# Patient Record
Sex: Female | Born: 1970 | Race: Black or African American | Hispanic: No | Marital: Single | Smoking: Never smoker
Health system: Southern US, Community
[De-identification: ages and names within clinical notes are randomized; demographics above are authoritative.]

## PROBLEM LIST (undated history)

## (undated) DIAGNOSIS — J189 Pneumonia, unspecified organism: Secondary | ICD-10-CM

## (undated) DIAGNOSIS — D649 Anemia, unspecified: Secondary | ICD-10-CM

## (undated) DIAGNOSIS — I10 Essential (primary) hypertension: Secondary | ICD-10-CM

## (undated) DIAGNOSIS — E119 Type 2 diabetes mellitus without complications: Secondary | ICD-10-CM

## (undated) HISTORY — PX: TUBAL LIGATION: SHX77

---

## 2004-09-09 ENCOUNTER — Ambulatory Visit: Payer: Self-pay | Admitting: Unknown Physician Specialty

## 2006-06-21 ENCOUNTER — Ambulatory Visit: Payer: Self-pay | Admitting: Gastroenterology

## 2007-07-02 ENCOUNTER — Emergency Department (HOSPITAL_COMMUNITY): Admission: EM | Admit: 2007-07-02 | Discharge: 2007-07-02 | Payer: Self-pay | Admitting: Emergency Medicine

## 2009-05-27 ENCOUNTER — Inpatient Hospital Stay (HOSPITAL_COMMUNITY): Admission: AD | Admit: 2009-05-27 | Discharge: 2009-05-27 | Payer: Self-pay | Admitting: Obstetrics & Gynecology

## 2010-03-31 LAB — WET PREP, GENITAL: Trich, Wet Prep: NONE SEEN

## 2010-06-25 ENCOUNTER — Emergency Department (HOSPITAL_BASED_OUTPATIENT_CLINIC_OR_DEPARTMENT_OTHER)
Admission: EM | Admit: 2010-06-25 | Discharge: 2010-06-25 | Disposition: A | Discharge status: Home / Self Care | Payer: No Typology Code available for payment source | Attending: Emergency Medicine | Admitting: Emergency Medicine

## 2010-06-25 ENCOUNTER — Emergency Department (INDEPENDENT_AMBULATORY_CARE_PROVIDER_SITE_OTHER): Payer: No Typology Code available for payment source

## 2010-06-25 DIAGNOSIS — M25539 Pain in unspecified wrist: Secondary | ICD-10-CM | POA: Insufficient documentation

## 2010-06-25 DIAGNOSIS — M542 Cervicalgia: Secondary | ICD-10-CM

## 2010-06-25 DIAGNOSIS — Y92410 Unspecified street and highway as the place of occurrence of the external cause: Secondary | ICD-10-CM | POA: Insufficient documentation

## 2010-06-25 DIAGNOSIS — S139XXA Sprain of joints and ligaments of unspecified parts of neck, initial encounter: Secondary | ICD-10-CM | POA: Insufficient documentation

## 2010-10-09 LAB — POCT URINALYSIS DIP (DEVICE)
Glucose, UA: NEGATIVE
Ketones, ur: NEGATIVE
Operator id: 282151
Specific Gravity, Urine: 1.025
Urobilinogen, UA: 0.2

## 2013-05-28 ENCOUNTER — Encounter (HOSPITAL_BASED_OUTPATIENT_CLINIC_OR_DEPARTMENT_OTHER): Payer: Self-pay | Admitting: Emergency Medicine

## 2013-05-28 ENCOUNTER — Emergency Department (HOSPITAL_BASED_OUTPATIENT_CLINIC_OR_DEPARTMENT_OTHER)
Admission: EM | Admit: 2013-05-28 | Discharge: 2013-05-28 | Disposition: A | Discharge status: Home / Self Care | Payer: Worker's Compensation | Attending: Emergency Medicine | Admitting: Emergency Medicine

## 2013-05-28 DIAGNOSIS — M545 Low back pain, unspecified: Secondary | ICD-10-CM

## 2013-05-28 DIAGNOSIS — X500XXA Overexertion from strenuous movement or load, initial encounter: Secondary | ICD-10-CM | POA: Insufficient documentation

## 2013-05-28 DIAGNOSIS — IMO0002 Reserved for concepts with insufficient information to code with codable children: Secondary | ICD-10-CM | POA: Insufficient documentation

## 2013-05-28 DIAGNOSIS — Y9289 Other specified places as the place of occurrence of the external cause: Secondary | ICD-10-CM | POA: Insufficient documentation

## 2013-05-28 DIAGNOSIS — X503XXA Overexertion from repetitive movements, initial encounter: Secondary | ICD-10-CM | POA: Insufficient documentation

## 2013-05-28 DIAGNOSIS — Y9389 Activity, other specified: Secondary | ICD-10-CM | POA: Insufficient documentation

## 2013-05-28 MED ORDER — PREDNISONE 50 MG PO TABS
60.0000 mg | ORAL_TABLET | Freq: Once | ORAL | Status: AC
  Administered 2013-05-28: 60 mg via ORAL
  Filled 2013-05-28 (×2): qty 1

## 2013-05-28 MED ORDER — PREDNISONE 10 MG PO TABS: 20.0000 mg | ORAL_TABLET | Freq: Every day | ORAL | Status: DC

## 2013-05-28 MED ORDER — OXYCODONE-ACETAMINOPHEN 5-325 MG PO TABS: 1.0000 | ORAL_TABLET | ORAL | Status: DC | PRN

## 2013-05-28 MED ORDER — OXYCODONE-ACETAMINOPHEN 5-325 MG PO TABS
2.0000 | ORAL_TABLET | Freq: Once | ORAL | Status: AC
  Administered 2013-05-28: 2 via ORAL
  Filled 2013-05-28: qty 2

## 2013-05-28 NOTE — ED Notes (Signed)
Onset of low back pain Thursday while lifting boxes at work.  Reports she felt the strain.  Pain has been affecting her ability to walk normally.  Pain radiates down her right leg occasionally.  Also reports numbness/tingling.

## 2013-05-28 NOTE — ED Provider Notes (Signed)
CSN: [id number]     Arrival date & time 05/28/13  1300 History   First MD Initiated Contact with Patient 05/28/13 1322     Chief Complaint  Patient presents with  . Back Pain     (Consider location/radiation/quality/duration/timing/severity/associated sxs/prior Treatment) Patient is a 43 y.o. female presenting with back pain. The history is provided by the patient.  Back Pain Location:  Lumbar spine Quality:  Aching Radiates to: right buttock. Pain severity:  Severe Pain is:  Worse during the night Onset quality:  Sudden Timing:  Constant Progression:  Unchanged Chronicity:  New Context: lifting heavy objects   Relieved by:  Nothing Worsened by:  Lying down Associated symptoms: no bladder incontinence, no bowel incontinence, no numbness, no paresthesias, no perianal numbness, no tingling and no weakness   Associated symptoms comment:  Patient seen at occupational health on Friday and started on muscle relaxants and anti-inflammatory medication. She states this has not helped. Risk factors: obesity   Risk factors: no hx of cancer, no recent surgery and no steroid use     History reviewed. No pertinent past medical history. Past Surgical History  Procedure Laterality Date  . Tubal ligation     No family history on file. History  Substance Use Topics  . Smoking status: Never Smoker   . Smokeless tobacco: Not on file  . Alcohol Use: Yes   OB History   Grav Para Term Preterm Abortions TAB SAB Ect Mult Living                 Review of Systems  Gastrointestinal: Negative for bowel incontinence.  Genitourinary: Negative for bladder incontinence.  Musculoskeletal: Positive for back pain.  Neurological: Negative for tingling, weakness, numbness and paresthesias.  All other systems reviewed and are negative.     Allergies  Review of patient's allergies indicates no known allergies.  Home Medications   Prior to Admission medications   Medication Sig Start Date End  Date Taking? Authorizing Provider  oxyCODONE-acetaminophen (PERCOCET/ROXICET) 5-325 MG per tablet Take 1 tablet by mouth every 4 (four) hours as needed for severe pain. 05/28/13   Hilario Quarryanielle S Robbin Escher, MD  predniSONE (DELTASONE) 10 MG tablet Take 2 tablets (20 mg total) by mouth daily. 05/28/13   Hilario Quarryanielle S Florrie Ramires, MD   BP 161/99  Pulse 71  Temp(Src) 98.7 F (37.1 C) (Oral)  Resp 20  Ht 5\' 1"  (1.549 m)  Wt 270 lb (122.471 kg)  BMI 51.04 kg/m2  SpO2 100%  LMP 05/24/2013 Physical Exam  Nursing note and vitals reviewed. Constitutional: She is oriented to person, place, and time. She appears well-developed and well-nourished.  HENT:  Head: Normocephalic and atraumatic.  Right Ear: Tympanic membrane and external ear normal.  Left Ear: Tympanic membrane and external ear normal.  Nose: Nose normal. Right sinus exhibits no maxillary sinus tenderness and no frontal sinus tenderness. Left sinus exhibits no maxillary sinus tenderness and no frontal sinus tenderness.  Eyes: Conjunctivae and EOM are normal. Pupils are equal, round, and reactive to light. Right eye exhibits no nystagmus. Left eye exhibits no nystagmus.  Neck: Normal range of motion. Neck supple.  Cardiovascular: Normal rate, regular rhythm, normal heart sounds and intact distal pulses.   Pulmonary/Chest: Effort normal and breath sounds normal. No respiratory distress. She exhibits no tenderness.  Abdominal: Soft. Bowel sounds are normal. She exhibits no distension and no mass. There is no tenderness.  Musculoskeletal: Normal range of motion. She exhibits no edema and no tenderness.  Neurological:  She is alert and oriented to person, place, and time. She has normal strength and normal reflexes. No sensory deficit. She displays a negative Romberg sign. GCS eye subscore is 4. GCS verbal subscore is 5. GCS motor subscore is 6.  Reflex Scores:      Tricep reflexes are 2+ on the right side and 2+ on the left side.      Bicep reflexes are 2+ on the  right side and 2+ on the left side.      Brachioradialis reflexes are 2+ on the right side and 2+ on the left side.      Patellar reflexes are 2+ on the right side and 2+ on the left side.      Achilles reflexes are 2+ on the right side and 2+ on the left side. Patient with normal gait without ataxia, shuffling, spasm, or antalgia. Speech is normal without dysarthria, dysphasia, or aphasia. Muscle strength is 5/5 in bilateral shoulders, elbow flexor and extensors, wrist flexor and extensors, and intrinsic hand muscles. 5/5 bilateral lower extremity hip flexors, extensors, knee flexors and extensors, and ankle dorsi and plantar flexors.    Skin: Skin is warm and dry. No rash noted.  Psychiatric: She has a normal mood and affect. Her behavior is normal. Judgment and thought content normal.    ED Course  Procedures (including critical care time) Labs Review Labs Reviewed - No data to display  Imaging Review No results found.   EKG Interpretation None      MDM   Final diagnoses:  Low back pain        Hilario Quarryanielle S Albaraa Swingle, MD 05/28/13 1512

## 2013-05-28 NOTE — Discharge Instructions (Signed)
Back Exercises °Back exercises help treat and prevent back injuries. The goal of back exercises is to increase the strength of your abdominal and back muscles and the flexibility of your back. These exercises should be started when you no longer have back pain. Back exercises include: °· Pelvic Tilt. Lie on your back with your knees bent. Tilt your pelvis until the lower part of your back is against the floor. Hold this position 5 to 10 sec and repeat 5 to 10 times. °· Knee to Chest. Pull first 1 knee up against your chest and hold for 20 to 30 seconds, repeat this with the other knee, and then both knees. This may be done with the other leg straight or bent, whichever feels better. °· Sit-Ups or Curl-Ups. Bend your knees 90 degrees. Start with tilting your pelvis, and do a partial, slow sit-up, lifting your trunk only 30 to 45 degrees off the floor. Take at least 2 to 3 seconds for each sit-up. Do not do sit-ups with your knees out straight. If partial sit-ups are difficult, simply do the above but with only tightening your abdominal muscles and holding it as directed. °· Hip-Lift. Lie on your back with your knees flexed 90 degrees. Push down with your feet and shoulders as you raise your hips a couple inches off the floor; hold for 10 seconds, repeat 5 to 10 times. °· Back arches. Lie on your stomach, propping yourself up on bent elbows. Slowly press on your hands, causing an arch in your low back. Repeat 3 to 5 times. Any initial stiffness and discomfort should lessen with repetition over time. °· Shoulder-Lifts. Lie face down with arms beside your body. Keep hips and torso pressed to floor as you slowly lift your head and shoulders off the floor. °Do not overdo your exercises, especially in the beginning. Exercises may cause you some mild back discomfort which lasts for a few minutes; however, if the pain is more severe, or lasts for more than 15 minutes, do not continue exercises until you see your caregiver.  Improvement with exercise therapy for back problems is slow.  °See your caregivers for assistance with developing a proper back exercise program. °Document Released: 02/06/2004 Document Revised: 03/23/2011 Document Reviewed: 10/30/2010 °ExitCare® Patient Information ©2014 [employer]. ° °Back Pain, Adult °Back pain is very common. The pain often gets better over time. The cause of back pain is usually not dangerous. Most people can learn to manage their back pain on their own.  °HOME CARE  °· Stay active. Start with short walks on flat ground if you can. Try to walk farther each day. °· Do not sit, drive, or stand in one place for more than 30 minutes. Do not stay in bed. °· Do not avoid exercise or work. Activity can help your back heal faster. °· Be careful when you bend or lift an object. Bend at your knees, keep the object close to you, and do not twist. °· Sleep on a firm mattress. Lie on your side, and bend your knees. If you lie on your back, put a pillow under your knees. °· Only take medicines as told by your doctor. °· Put ice on the injured area. °· Put ice in a plastic bag. °· Place a towel between your skin and the bag. °· Leave the ice on for 15-20 minutes, 03-04 times a day for the first 2 to 3 days. After that, you can switch between ice and heat packs. °· Ask your   doctor about back exercises or massage. °· Avoid feeling anxious or stressed. Find good ways to deal with stress, such as exercise. °GET HELP RIGHT AWAY IF:  °· Your pain does not go away with rest or medicine. °· Your pain does not go away in 1 week. °· You have new problems. °· You do not feel well. °· The pain spreads into your legs. °· You cannot control when you poop (bowel movement) or pee (urinate). °· Your arms or legs feel weak or lose feeling (numbness). °· You feel sick to your stomach (nauseous) or throw up (vomit). °· You have belly (abdominal) pain. °· You feel like you may pass out (faint). °MAKE SURE YOU:  °· Understand  these instructions. °· Will watch your condition. °· Will get help right away if you are not doing well or get worse. °Document Released: 06/17/2007 Document Revised: 03/23/2011 Document Reviewed: 05/19/2010 °ExitCare® Patient Information ©2014 [employer]. ° °

## 2013-06-12 ENCOUNTER — Other Ambulatory Visit: Payer: Self-pay | Admitting: Occupational Medicine

## 2013-06-12 ENCOUNTER — Ambulatory Visit: Payer: Worker's Compensation

## 2013-06-12 DIAGNOSIS — M549 Dorsalgia, unspecified: Secondary | ICD-10-CM

## 2013-07-14 ENCOUNTER — Encounter (HOSPITAL_BASED_OUTPATIENT_CLINIC_OR_DEPARTMENT_OTHER): Payer: Self-pay | Admitting: Emergency Medicine

## 2013-07-14 ENCOUNTER — Emergency Department (HOSPITAL_BASED_OUTPATIENT_CLINIC_OR_DEPARTMENT_OTHER): Payer: Self-pay

## 2013-07-14 ENCOUNTER — Emergency Department (HOSPITAL_BASED_OUTPATIENT_CLINIC_OR_DEPARTMENT_OTHER)
Admission: EM | Admit: 2013-07-14 | Discharge: 2013-07-15 | Disposition: A | Discharge status: Home / Self Care | Payer: Self-pay | Attending: Emergency Medicine | Admitting: Emergency Medicine

## 2013-07-14 DIAGNOSIS — IMO0002 Reserved for concepts with insufficient information to code with codable children: Secondary | ICD-10-CM | POA: Insufficient documentation

## 2013-07-14 DIAGNOSIS — J159 Unspecified bacterial pneumonia: Secondary | ICD-10-CM | POA: Insufficient documentation

## 2013-07-14 DIAGNOSIS — R071 Chest pain on breathing: Secondary | ICD-10-CM | POA: Insufficient documentation

## 2013-07-14 DIAGNOSIS — IMO0001 Reserved for inherently not codable concepts without codable children: Secondary | ICD-10-CM | POA: Insufficient documentation

## 2013-07-14 DIAGNOSIS — J029 Acute pharyngitis, unspecified: Secondary | ICD-10-CM | POA: Insufficient documentation

## 2013-07-14 DIAGNOSIS — Z79899 Other long term (current) drug therapy: Secondary | ICD-10-CM | POA: Insufficient documentation

## 2013-07-14 DIAGNOSIS — R509 Fever, unspecified: Secondary | ICD-10-CM | POA: Insufficient documentation

## 2013-07-14 DIAGNOSIS — J189 Pneumonia, unspecified organism: Secondary | ICD-10-CM

## 2013-07-14 DIAGNOSIS — R0789 Other chest pain: Secondary | ICD-10-CM

## 2013-07-14 LAB — BASIC METABOLIC PANEL
Anion gap: 14 (ref 5–15)
BUN: 9 mg/dL (ref 6–23)
CALCIUM: 9.3 mg/dL (ref 8.4–10.5)
CO2: 24 meq/L (ref 19–32)
CREATININE: 1 mg/dL (ref 0.50–1.10)
Chloride: 101 mEq/L (ref 96–112)
GFR calc Af Amer: 78 mL/min — ABNORMAL LOW (ref >90)
GFR calc non Af Amer: 67 mL/min — ABNORMAL LOW (ref >90)
GLUCOSE: 118 mg/dL — AB (ref 70–99)
Potassium: 4.1 mEq/L (ref 3.7–5.3)
Sodium: 139 mEq/L (ref 137–147)

## 2013-07-14 LAB — CBC WITH DIFFERENTIAL/PLATELET
BASOS PCT: 0 % (ref 0–1)
Basophils Absolute: 0 10*3/uL (ref 0.0–0.1)
EOS ABS: 0.3 10*3/uL (ref 0.0–0.7)
Eosinophils Relative: 3 % (ref 0–5)
HEMATOCRIT: 27.9 % — AB (ref 36.0–46.0)
HEMOGLOBIN: 8.3 g/dL — AB (ref 12.0–15.0)
Lymphocytes Relative: 22 % (ref 12–46)
Lymphs Abs: 1.9 10*3/uL (ref 0.7–4.0)
MCH: 20 pg — AB (ref 26.0–34.0)
MCHC: 29.7 g/dL — AB (ref 30.0–36.0)
MCV: 67.2 fL — ABNORMAL LOW (ref 78.0–100.0)
MONO ABS: 1 10*3/uL (ref 0.1–1.0)
Monocytes Relative: 11 % (ref 3–12)
NEUTROS ABS: 5.6 10*3/uL (ref 1.7–7.7)
NEUTROS PCT: 64 % (ref 43–77)
Platelets: 300 10*3/uL (ref 150–400)
RBC: 4.15 MIL/uL (ref 3.87–5.11)
RDW: 19.7 % — AB (ref 11.5–15.5)
WBC: 8.8 10*3/uL (ref 4.0–10.5)

## 2013-07-14 MED ORDER — SODIUM CHLORIDE 0.9 % IV BOLUS (SEPSIS)
1000.0000 mL | Freq: Once | INTRAVENOUS | Status: AC
  Administered 2013-07-14: 1000 mL via INTRAVENOUS

## 2013-07-14 MED ORDER — KETOROLAC TROMETHAMINE 30 MG/ML IJ SOLN
30.0000 mg | Freq: Once | INTRAMUSCULAR | Status: AC
  Administered 2013-07-14: 30 mg via INTRAVENOUS
  Filled 2013-07-14: qty 1

## 2013-07-14 MED ORDER — DEXTROSE 5 % IV SOLN
1.0000 g | Freq: Once | INTRAVENOUS | Status: AC
  Administered 2013-07-14: 1 g via INTRAVENOUS

## 2013-07-14 MED ORDER — IOHEXOL 300 MG/ML  SOLN
80.0000 mL | Freq: Once | INTRAMUSCULAR | Status: AC | PRN
  Administered 2013-07-14: 80 mL via INTRAVENOUS

## 2013-07-14 MED ORDER — CEFTRIAXONE SODIUM 1 G IJ SOLR
INTRAMUSCULAR | Status: AC
  Filled 2013-07-14: qty 10

## 2013-07-14 NOTE — ED Provider Notes (Signed)
CSN: [id number]     Arrival date & time 07/14/13  1934 History  This chart was scribed for Loren Raceravid Altha Sweitzer, MD by Evon Slackerrance Branch, ED Scribe. This patient was seen in room MH01/MH01 and the patient's care was started at 11:04 PM.     Chief Complaint  Patient presents with  . Cough   Patient is a 43 y.o. female presenting with cough. The history is provided by the patient. No language interpreter was used.  Cough Associated symptoms: chest pain, chills, eye discharge, fever, myalgias and sore throat   Associated symptoms: no headaches, no rash and no shortness of breath    HPI Comments: [name] [name] is a 43 y.o. female who presents to the Emergency Department complaining of productive cough of yellow sputum onset 4 days. States she has associated chest pain brought on from cough. She also complains of fever, chills, sore throat and left eye redness. She denies being recently admitted to the hospital. She denies nausea, abdominal pain and vomiting. She has no lower extremity swelling or pain.  She states that she doesn't currently have a PCP  History reviewed. No pertinent past medical history. Past Surgical History  Procedure Laterality Date  . Tubal ligation     No family history on file. History  Substance Use Topics  . Smoking status: Never Smoker   . Smokeless tobacco: Not on file  . Alcohol Use: Yes   OB History   Grav Para Term Preterm Abortions TAB SAB Ect Mult Living                 Review of Systems  Constitutional: Positive for fever and chills.  HENT: Positive for sore throat.   Eyes: Positive for discharge and redness.  Respiratory: Positive for cough. Negative for shortness of breath.   Cardiovascular: Positive for chest pain. Negative for palpitations and leg swelling.  Gastrointestinal: Negative for nausea, abdominal pain and diarrhea.  Genitourinary: Negative for dysuria.  Musculoskeletal: Positive for myalgias. Negative for back pain, neck pain and neck  stiffness.  Skin: Negative for rash and wound.  Neurological: Negative for dizziness, weakness, light-headedness, numbness and headaches.  All other systems reviewed and are negative.   Allergies  Review of patient's allergies indicates no known allergies.  Home Medications   Prior to Admission medications   Medication Sig Start Date End Date Taking? Authorizing Provider  oxyCODONE-acetaminophen (PERCOCET/ROXICET) 5-325 MG per tablet Take 1 tablet by mouth every 4 (four) hours as needed for severe pain. 05/28/13   Hilario Quarryanielle S Ray, MD  predniSONE (DELTASONE) 10 MG tablet Take 2 tablets (20 mg total) by mouth daily. 05/28/13   Hilario Quarryanielle S Ray, MD   Triage Vitals: BP 146/91  Pulse 121  Temp(Src) 99.2 F (37.3 C) (Oral)  Resp 20  Ht 5\' 5"  (1.651 m)  Wt 270 lb (122.471 kg)  BMI 44.93 kg/m2  SpO2 97%  LMP 06/14/2013  Physical Exam  Nursing note and vitals reviewed. Constitutional: She is oriented to person, place, and time. She appears well-developed and well-nourished. No distress.  HENT:  Head: Normocephalic and atraumatic.  Mouth/Throat: Oropharynx is clear and moist. No oropharyngeal exudate.  Eyes: EOM are normal. Pupils are equal, round, and reactive to light. Right eye exhibits discharge.  Right scleral injection with purulent discharge.  Neck: Normal range of motion. Neck supple.  No meningismus  Cardiovascular: Normal rate and regular rhythm.  Exam reveals no gallop and no friction rub.   No murmur heard. Pulmonary/Chest: Effort normal. No  respiratory distress. She has no wheezes. She has no rales.  Decreased breath sounds in the bases.  Abdominal: Soft. Bowel sounds are normal. She exhibits no distension and no mass. There is no tenderness. There is no rebound and no guarding.  Musculoskeletal: Normal range of motion. She exhibits no edema and no tenderness.  No calf swelling or tenderness.  Neurological: She is alert and oriented to person, place, and time.  Moves all  extremities without deficit. Sensation is grossly intact.  Skin: Skin is warm and dry. No rash noted. No erythema.  Psychiatric: She has a normal mood and affect. Her behavior is normal.    ED Course  Procedures (including critical care time) DIAGNOSTIC STUDIES: Oxygen Saturation is 97% on RA, normal by my interpretation.    COORDINATION OF CARE:    Labs Review Labs Reviewed  CBC WITH DIFFERENTIAL - Abnormal; Notable for the following:    Hemoglobin 8.3 (*)    HCT 27.9 (*)    MCV 67.2 (*)    MCH 20.0 (*)    MCHC 29.7 (*)    RDW 19.7 (*)    All other components within normal limits  BASIC METABOLIC PANEL - Abnormal; Notable for the following:    Glucose, Bld 118 (*)    GFR calc non Af Amer 67 (*)    GFR calc Af Amer 78 (*)    All other components within normal limits    Imaging Review Dg Chest 2 View  07/14/2013   CLINICAL DATA:  Four day history of cough.  EXAM: CHEST  2 VIEW  COMPARISON:  None.  FINDINGS: Cardiac silhouette normal in size. Right paratracheal opacity at the superior right heart border. Linear atelectasis in the inferior right upper lobe. Lungs otherwise clear. No localized airspace consolidation. No pleural effusions. No pneumothorax. Normal pulmonary vascularity. Visualized bony thorax intact.  IMPRESSION: 1. Right paratracheal opacity at the superior right heart border, possible mass or adenopathy versus ectatic ascending thoracic aorta. CT of the chest with contrast is suggested in further evaluation. 2. Associated atelectasis involving the inferior right upper lobe. No acute cardiopulmonary disease otherwise.   Electronically Signed   By: Hulan Saas M.D.   On: 07/14/2013 19:53   Ct Chest W Contrast  07/14/2013   CLINICAL DATA:  Abnormal chest radiograph, cough  EXAM: CT CHEST WITH CONTRAST  TECHNIQUE: Multidetector CT imaging of the chest was performed during intravenous contrast administration.  CONTRAST:  80mL OMNIPAQUE IOHEXOL 300 MG/ML  SOLN   COMPARISON:  Chest radiograph dated 07/14/2013  FINDINGS: Radiographic abnormality corresponds to a vascular contour. No hilar/perihilar mass is evident on CT.  Mild patchy opacity in the lateral right upper lobe (series 3/image 19). Additional mild patchy subpleural opacity in the left lower lobe (series 3/image 31). Given the clinical history, this appearance is suspicious for pneumonia.  Additional mild linear opacity in the bilateral lower lobes, likely atelectasis. No suspicious pulmonary nodules. No pleural effusion or pneumothorax.  Visualized thyroid is unremarkable.  The heart is normal in size.  No pericardial effusion.  No suspicious mediastinal, hilar, or axillary lymphadenopathy.  Visualized upper abdomen is unremarkable.  Mild degenerative changes of the visualized thoracolumbar spine.  IMPRESSION: Radiographic abnormality corresponds to a vascular contour. No hilar/perihilar mass is seen on CT.  Mild patchy opacity in the right upper lobe and left lower lobe, suspicious for mild multifocal pneumonia.   Electronically Signed   By: Charline Bills M.D.   On: 07/14/2013 21:38  EKG Interpretation None      MDM   Final diagnoses:  None       I personally performed the services described in this documentation, which was scribed in my presence. The recorded information has been reviewed and is accurate.  Patient with mild pneumonia by CT chest. There is no evidence of mass. Chest pain is consistent with musculoskeletal chest pain likely due to coughing and muscle strain. Very low suspicion for coronary artery disease. Her symptoms are improved after IV fluid and Toradol. Heart rate has improved to less than 100. She is maintaining her oxygen saturations and is in no respiratory distress. She received an IV dose of Rocephin in emergency department and will be discharged home with Z-Pak. She's been given very strict return precautions including the need to return immediately for  worsening pain, difficulty breathing, persistent fever or for any concerns.     Loren Raceravid Harout Scheurich, MD 07/15/13 [phone]

## 2013-07-14 NOTE — ED Notes (Signed)
Cough with yellow sputum x 4 days.

## 2013-07-14 NOTE — ED Notes (Signed)
MD at bedside. 

## 2013-07-14 NOTE — ED Notes (Signed)
Pt c/o productive cough x 4 days  When first start had blood tinged sputum  Now it is yellow

## 2013-07-15 MED ORDER — BENZONATATE 100 MG PO CAPS: 100.0000 mg | ORAL_CAPSULE | Freq: Three times a day (TID) | ORAL | Status: DC

## 2013-07-15 MED ORDER — AZITHROMYCIN 250 MG PO TABS: ORAL_TABLET | ORAL | Status: DC

## 2013-07-15 MED ORDER — BACITRACIN-POLYMYXIN B 500-10000 UNIT/GM OP OINT: 1.0000 "application " | TOPICAL_OINTMENT | Freq: Two times a day (BID) | OPHTHALMIC | Status: DC

## 2013-07-15 MED ORDER — IBUPROFEN 600 MG PO TABS: 600.0000 mg | ORAL_TABLET | Freq: Four times a day (QID) | ORAL | Status: DC | PRN

## 2013-07-15 NOTE — Discharge Instructions (Signed)
Pneumonia, Adult °Pneumonia is an infection of the lungs.  °CAUSES °Pneumonia may be caused by bacteria or a virus. Usually, these infections are caused by breathing infectious particles into the lungs (respiratory tract). °SYMPTOMS  °· Cough. °· Fever. °· Chest pain. °· Increased rate of breathing. °· Wheezing. °· Mucus production. °DIAGNOSIS  °If you have the common symptoms of pneumonia, your caregiver will typically confirm the diagnosis with a chest X-ray. The X-ray will show an abnormality in the lung (pulmonary infiltrate) if you have pneumonia. Other tests of your blood, urine, or sputum may be done to find the specific cause of your pneumonia. Your caregiver may also do tests (blood gases or pulse oximetry) to see how well your lungs are working. °TREATMENT  °Some forms of pneumonia may be spread to other people when you cough or sneeze. You may be asked to wear a mask before and during your exam. Pneumonia that is caused by bacteria is treated with antibiotic medicine. Pneumonia that is caused by the influenza virus may be treated with an antiviral medicine. Most other viral infections must run their course. These infections will not respond to antibiotics.  °PREVENTION °A pneumococcal shot (vaccine) is available to prevent a common bacterial cause of pneumonia. This is usually suggested for: °· People over 65 years old. °· Patients on chemotherapy. °· People with chronic lung problems, such as bronchitis or emphysema. °· People with immune system problems. °If you are over 65 or have a high risk condition, you may receive the pneumococcal vaccine if you have not received it before. In some countries, a routine influenza vaccine is also recommended. This vaccine can help prevent some cases of pneumonia. You may be offered the influenza vaccine as part of your care. °If you smoke, it is time to quit. You may receive instructions on how to stop smoking. Your caregiver can provide medicines and counseling to  help you quit. °HOME CARE INSTRUCTIONS  °· Cough suppressants may be used if you are losing too much rest. However, coughing protects you by clearing your lungs. You should avoid using cough suppressants if you can. °· Your caregiver may have prescribed medicine if he or she thinks your pneumonia is caused by a bacteria or influenza. Finish your medicine even if you start to feel better. °· Your caregiver may also prescribe an expectorant. This loosens the mucus to be coughed up. °· Only take over-the-counter or prescription medicines for pain, discomfort, or fever as directed by your caregiver. °· Do not smoke. Smoking is a common cause of bronchitis and can contribute to pneumonia. If you are a smoker and continue to smoke, your cough may last several weeks after your pneumonia has cleared. °· A cold steam vaporizer or humidifier in your room or home may help loosen mucus. °· Coughing is often worse at night. Sleeping in a semi-upright position in a recliner or using a couple pillows under your head will help with this. °· Get rest as you feel it is needed. Your body will usually let you know when you need to rest. °SEEK IMMEDIATE MEDICAL CARE IF:  °· Your illness becomes worse. This is especially true if you are elderly or weakened from any other disease. °· You cannot control your cough with suppressants and are losing sleep. °· You begin coughing up blood. °· You develop pain which is getting worse or is uncontrolled with medicines. °· You have a fever. °· Any of the symptoms which initially brought you in for treatment   are getting worse rather than better.  You develop shortness of breath or chest pain. MAKE SURE YOU:   Understand these instructions.  Will watch your condition.  Will get help right away if you are not doing well or get worse. Document Released: 12/29/2004 Document Revised: 03/23/2011 Document Reviewed: 03/20/2010 [facility]Exit[facility] Patient Information 2015 [place]Care, [place]. This information is  not intended to replace advice given to you by your health care provider. Make sure you discuss any questions you have with your health care provider.  Chest Wall Pain Chest wall pain is pain felt in or around the chest bones and muscles. It may take up to 6 weeks to get better. It may take longer if you are active. Chest wall pain can happen on its own. Other times, things like germs, injury, coughing, or exercise can cause the pain. HOME CARE   Avoid activities that make you tired or cause pain. Try not to use your chest, belly (abdominal), or side muscles. Do not use heavy weights.  Put ice on the sore area.  Put ice in a plastic bag.  Place a towel between your skin and the bag.  Leave the ice on for 15-20 minutes for the first 2 days.  Only take medicine as told by your doctor. GET HELP RIGHT AWAY IF:   You have more pain or are very uncomfortable.  You have a fever.  Your chest pain gets worse.  You have new problems.  You feel sick to your stomach (nauseous) or throw up (vomit).  You start to sweat or feel lightheaded.  You have a cough with mucus (phlegm).  You cough up blood. MAKE SURE YOU:   Understand these instructions.  Will watch your condition.  Will get help right away if you are not doing well or get worse. Document Released: 06/17/2007 Document Revised: 03/23/2011 Document Reviewed: 08/25/2010 [facility] Patient Information 2015 [place]Care, [place]LLC. This information is not intended to replace advice given to you by your health care provider. Make sure you discuss any questions you have with your health care provider.

## 2013-10-12 NOTE — Progress Notes (Signed)
Please put orders in Epic surgery 10-25-13 pre op 10-17-13 Thanks

## 2013-10-13 ENCOUNTER — Ambulatory Visit: Payer: Self-pay | Admitting: Orthopedic Surgery

## 2013-10-16 ENCOUNTER — Encounter (HOSPITAL_COMMUNITY): Payer: Self-pay | Admitting: Pharmacy Technician

## 2013-10-17 ENCOUNTER — Ambulatory Visit (HOSPITAL_COMMUNITY): Admission: RE | Admit: 2013-10-17 | Payer: No Typology Code available for payment source | Source: Ambulatory Visit

## 2013-10-17 ENCOUNTER — Ambulatory Visit (HOSPITAL_COMMUNITY)
Admission: RE | Admit: 2013-10-17 | Discharge: 2013-10-17 | Disposition: A | Discharge status: Home / Self Care | Payer: Worker's Compensation | Source: Ambulatory Visit | Attending: Anesthesiology | Admitting: Anesthesiology

## 2013-10-17 ENCOUNTER — Encounter (HOSPITAL_COMMUNITY)
Admission: RE | Admit: 2013-10-17 | Discharge: 2013-10-17 | Disposition: A | Discharge status: Home / Self Care | Payer: Worker's Compensation | Source: Ambulatory Visit | Attending: Specialist | Admitting: Specialist

## 2013-10-17 ENCOUNTER — Encounter (HOSPITAL_COMMUNITY): Payer: Self-pay

## 2013-10-17 ENCOUNTER — Ambulatory Visit (HOSPITAL_COMMUNITY)
Admission: RE | Admit: 2013-10-17 | Discharge: 2013-10-17 | Disposition: A | Discharge status: Home / Self Care | Payer: Worker's Compensation | Source: Ambulatory Visit | Attending: Orthopedic Surgery | Admitting: Orthopedic Surgery

## 2013-10-17 DIAGNOSIS — M5126 Other intervertebral disc displacement, lumbar region: Secondary | ICD-10-CM | POA: Insufficient documentation

## 2013-10-17 DIAGNOSIS — M47896 Other spondylosis, lumbar region: Secondary | ICD-10-CM | POA: Diagnosis not present

## 2013-10-17 DIAGNOSIS — Z0000 Encounter for general adult medical examination without abnormal findings: Secondary | ICD-10-CM | POA: Insufficient documentation

## 2013-10-17 HISTORY — DX: Pneumonia, unspecified organism: J18.9

## 2013-10-17 LAB — BASIC METABOLIC PANEL
Anion gap: 10 (ref 5–15)
BUN: 9 mg/dL (ref 6–23)
CO2: 25 meq/L (ref 19–32)
CREATININE: 0.95 mg/dL (ref 0.50–1.10)
Calcium: 8.9 mg/dL (ref 8.4–10.5)
Chloride: 100 mEq/L (ref 96–112)
GFR calc Af Amer: 83 mL/min — ABNORMAL LOW (ref >90)
GFR calc non Af Amer: 71 mL/min — ABNORMAL LOW (ref >90)
Glucose, Bld: 104 mg/dL — ABNORMAL HIGH (ref 70–99)
Potassium: 4.4 mEq/L (ref 3.7–5.3)
Sodium: 135 mEq/L — ABNORMAL LOW (ref 137–147)

## 2013-10-17 LAB — CBC
HEMATOCRIT: 30.2 % — AB (ref 36.0–46.0)
Hemoglobin: 9 g/dL — ABNORMAL LOW (ref 12.0–15.0)
MCH: 20 pg — ABNORMAL LOW (ref 26.0–34.0)
MCHC: 29.8 g/dL — AB (ref 30.0–36.0)
MCV: 67 fL — ABNORMAL LOW (ref 78.0–100.0)
Platelets: 311 10*3/uL (ref 150–400)
RBC: 4.51 MIL/uL (ref 3.87–5.11)
RDW: 19.9 % — ABNORMAL HIGH (ref 11.5–15.5)
WBC: 5.9 10*3/uL (ref 4.0–10.5)

## 2013-10-17 LAB — HCG, SERUM, QUALITATIVE: Preg, Serum: NEGATIVE

## 2013-10-17 NOTE — Progress Notes (Signed)
Pt to ER on 07/14/2013 had cxr and CT of chest preformed both in epic pt treated for pneumonia pt denies any bouts with pneumonia during PAT visit new cxr obtained

## 2013-10-17 NOTE — Progress Notes (Signed)
BMP and CBC results per PAT visit on 10/17/2013 / epic sent to Dr Jene EveryJeffrey Beane

## 2013-10-17 NOTE — Patient Instructions (Signed)
20 [name] BihariShonda [name]  10/17/2013   Your procedure is scheduled on:        10/25/2013  [facility] and follow signs to  Short Stay Center arrive at 0900 AM.   Call this number if you have problems the morning of surgery [phone] or Presurgical Testing [phone].   Remember:  Do not eat food or drink liquids :After Midnight.  For Living Will and/or Health Care Power Attorney Forms: please provide copy for your medical record, may bring AM of surgery (forms should be already notarized-we do not provide this service).    Take these medicines the morning of surgery with A SIP OF WATER: hydrocodone-acetaminophen if needed for pain.                               You may not have any metal on your body including hair pins and piercings  Do not wear jewelry, make-up, lotions, powders, or deodorant.  Do not shave body hair  48 hours(2 days) of CHG soap use.                Do not bring valuables to the hospital. [facility] IS NOT RESPONSIBLE FOR VALUABLES.  Contacts, dentures or bridgework may not be worn into surgery.  Leave suitcase in the car. After surgery it may be brought to your room.  For patients admitted to the hospital, checkout time is 11:00 AM the day of discharge.     Special Instructions: review fact sheets for Incentive Spirometry.  ________________________________________________________________________  [facility] - Preparing for Surgery Before surgery, you can play an important role.  Because skin is not sterile, your skin needs to be as free of germs as possible.  You can reduce the number of germs on your skin by washing with CHG (chlorahexidine gluconate) soap before surgery.  CHG is an antiseptic cleaner which kills germs and bonds with the skin to continue killing germs even after washing. Please DO NOT use if you have an allergy to CHG or antibacterial soaps.  If your skin becomes reddened/irritated stop using the CHG and inform your  nurse when you arrive at Short Stay. Do not shave (including legs and underarms) for at least 48 hours prior to the first CHG shower.  You may shave your face/neck. Please follow these instructions carefully:  1.  Shower with CHG Soap the night before surgery and the  morning of Surgery.  2.  If you choose to wash your hair, wash your hair first as usual with your  normal  shampoo.  3.  After you shampoo, rinse your hair and body thoroughly to remove the  shampoo.                           4.  Use CHG as you would any other liquid soap.  You can apply chg directly  to the skin and wash                       Gently with a scrungie or clean washcloth.  5.  Apply the CHG Soap to your body ONLY FROM THE NECK DOWN.   Do not use on face/ open                           Wound or open sores.  Avoid contact with eyes, ears mouth and genitals (private parts).                       Wash face,  Genitals (private parts) with your normal soap.             6.  Wash thoroughly, paying special attention to the area where your surgery  will be performed.  7.  Thoroughly rinse your body with warm water from the neck down.  8.  DO NOT shower/wash with your normal soap after using and rinsing off  the CHG Soap.                9.  Pat yourself dry with a clean towel.            10.  Wear clean pajamas.            11.  Place clean sheets on your bed the night of your first shower and do not  sleep with pets. Day of Surgery : Do not apply any lotions/deodorants the morning of surgery.  Please wear clean clothes to the hospital/surgery center.  FAILURE TO FOLLOW THESE INSTRUCTIONS MAY RESULT IN THE CANCELLATION OF YOUR SURGERY PATIENT SIGNATURE_________________________________  NURSE SIGNATURE__________________________________  ________________________________________________________________________   [name]  An incentive spirometer is a tool that can help keep your lungs clear and active. This tool  measures how well you are filling your lungs with each breath. Taking long deep breaths may help reverse or decrease the chance of developing breathing (pulmonary) problems (especially infection) following:  A long period of time when you are unable to move or be active. BEFORE THE PROCEDURE   If the spirometer includes an indicator to show your best effort, your nurse or respiratory therapist will set it to a desired goal.  If possible, sit up straight or lean slightly forward. Try not to slouch.  Hold the incentive spirometer in an upright position. INSTRUCTIONS FOR USE  1. Sit on the edge of your bed if possible, or sit up as far as you can in bed or on a chair. 2. Hold the incentive spirometer in an upright position. 3. Breathe out normally. 4. Place the mouthpiece in your mouth and seal your lips tightly around it. 5. Breathe in slowly and as deeply as possible, raising the piston or the ball toward the top of the column. 6. Hold your breath for 3-5 seconds or for as long as possible. Allow the piston or ball to fall to the bottom of the column. 7. Remove the mouthpiece from your mouth and breathe out normally. 8. Rest for a few seconds and repeat Steps 1 through 7 at least 10 times every 1-2 hours when you are awake. Take your time and take a few normal breaths between deep breaths. 9. The spirometer may include an indicator to show your best effort. Use the indicator as a goal to work toward during each repetition. 10. After each set of 10 deep breaths, practice coughing to be sure your lungs are clear. If you have an incision (the cut made at the time of surgery), support your incision when coughing by placing a pillow or rolled up towels firmly against it. Once you are able to get out of bed, walk around indoors and cough well. You may stop using the incentive spirometer when instructed by your caregiver.  RISKS AND COMPLICATIONS  Take your time so you do not get dizzy or  light-headed.  If you are in pain, you may need to take or ask for pain medication before doing incentive spirometry. It is harder to take a deep breath if you are having pain. AFTER USE  Rest and breathe slowly and easily.  It can be helpful to keep track of a log of your progress. Your caregiver can provide you with a simple table to help with this. If you are using the spirometer at home, follow these instructions: Golden IF:   You are having difficultly using the spirometer.  You have trouble using the spirometer as often as instructed.  Your pain medication is not giving enough relief while using the spirometer.  You develop fever of 100.5 F (38.1 C) or higher. SEEK IMMEDIATE MEDICAL CARE IF:   You cough up bloody sputum that had not been present before.  You develop fever of 102 F (38.9 C) or greater.  You develop worsening pain at or near the incision site. MAKE SURE YOU:   Understand these instructions.  Will watch your condition.  Will get help right away if you are not doing well or get worse. Document Released: 05/11/2006 Document Revised: 03/23/2011 Document Reviewed: 07/12/2006 [facility] Patient Information 2014 [place], [place].   ________________________________________________________________________

## 2013-10-23 ENCOUNTER — Ambulatory Visit: Payer: Self-pay | Admitting: Orthopedic Surgery

## 2013-10-23 NOTE — H&P (Signed)
[name] [name] is an 43 y.o. female.   Chief Complaint: back and B/L leg pain HPI: The patient is a 43 year old female who presents today for follow up of their back. The patient is being followed for their back pain. They are now 4 1/2 months out from injury (DOI 05/25/2013). Symptoms reported today include: pain, weakness and leg pain (bilateral but the right is worse than the left). The patient states that they are doing poorly. Current treatment includes: activity modification and NSAIDs. The following medication has been used for pain control: ibuprofen. The patient reports their current pain level to be 8 / 10. The patient presents today following ESI right L5-S1 x 13 days. The patient reports the injection helped for 3 days. Note for "Follow-up back": The patient is currently out of work. NCM is [name] [name].  Past Medical History  Diagnosis Date  . Pneumonia     pt to ER 07/14/2013 for community acquired pneumonia pt denies having pneumonia     Past Surgical History  Procedure Laterality Date  . Tubal ligation      No family history on file. Social History:  reports that she has never smoked. She has never used smokeless tobacco. She reports that she does not drink alcohol or use illicit drugs.  Allergies: No Known Allergies   (Not in a hospital admission)  No results found for this or any previous visit (from the past 48 hour(s)). No results found.  Review of Systems  Constitutional: Negative.   HENT: Negative.   Eyes: Negative.   Respiratory: Negative.   Cardiovascular: Negative.   Gastrointestinal: Negative.   Genitourinary: Negative.   Musculoskeletal: Positive for back pain.  Skin: Negative.   Neurological: Negative.     Last menstrual period 10/17/2013. Physical Exam  Constitutional: She is oriented to person, place, and time. She appears well-developed and well-nourished. She appears distressed.  HENT:  Head: Normocephalic and atraumatic.  Eyes:  Conjunctivae and EOM are normal. Pupils are equal, round, and reactive to light.  Neck: Normal range of motion. Neck supple.  Cardiovascular: Normal rate and regular rhythm.   Respiratory: Effort normal and breath sounds normal.  GI: Soft. Bowel sounds are normal.  Musculoskeletal:  On exam, moderate distress. Mood and affect is appropriate. Walks with an antalgic gait. Straight leg raise on the right produces buttock, thigh, and calf pain and on the left buttock and thigh pain. Pain with extension and end flexion of the lumbar spine. Lumbar spine exam reveals no evidence of soft tissue swelling, ecchymosis or deformity. On palpation there is no flank pain with percussion. Nontender over the trochanters.   Neurological: She is alert and oriented to person, place, and time. She has normal reflexes.  Skin: Skin is warm and dry.  Psychiatric: She has a normal mood and affect.    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  Assessment/Plan 1. 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 Temporary relief from an epidural. 2. Obesity. 3. Facet arthrosis at 4-5.  We had extensive discussion with [name] concerning current pathology, relevant anatomy, and treatment options. There are two options. One is to live with her symptoms at maximum medical improvement and impairment rating of 5% p.r.n. follow-up verses lumbar decompression on the right and probably on the left as well. She would have perhaps residual back pain related to the facet   arthritis at 4-5 and also disc degeneration at 5-1 and need to protect against loading the discs postoperatively. We specifically talked about reducing the risk for recurrent disc herniation. Overnight in the hospital. In two weeks suture removal. Therapy referral at six weeks for work simulation and at three months  maximum medical improvement. I gave her a prescription for Norco to be taken as directed. Continue with her current work status. Discussed this separately and in front of the patient with her case manager, [name] MorseEileen [name]. No other options at this point in time.  I had an extensive discussion of the risks and benefits of the lumbar decompression with the patient including bleeding, infection, damage to neurovascular structures, epidural fibrosis, CSF leak requiring repair. We also discussed increase in pain, adjacent segment disease, recurrent disc herniation, need for future surgery including repeat decompression and/or fusion. We also discussed risks of postoperative hematoma, paralysis, anesthetic complications including DVT, PE, death, cardiopulmonary dysfunction. In addition, the perioperative and postoperative courses were discussed in detail including the rehabilitative time and return to functional activity and work. I provided the patient with an illustrated handout and utilized the appropriate surgical models.  Plan microlumbar decompression L5-S1 bilateral  [name] [name] M. PA-C for Dr. Shelle [name] 10/23/2013, 12:33 PM

## 2013-10-25 ENCOUNTER — Encounter (HOSPITAL_COMMUNITY): Payer: Worker's Compensation | Admitting: *Deleted

## 2013-10-25 ENCOUNTER — Ambulatory Visit (HOSPITAL_COMMUNITY)
Admission: RE | Admit: 2013-10-25 | Discharge: 2013-10-27 | Disposition: A | Discharge status: Home / Self Care | Payer: Worker's Compensation | Source: Ambulatory Visit | Attending: Specialist | Admitting: Specialist

## 2013-10-25 ENCOUNTER — Encounter (HOSPITAL_COMMUNITY)
Admission: RE | Disposition: A | Discharge status: Home / Self Care | Payer: Self-pay | Source: Ambulatory Visit | Attending: Specialist

## 2013-10-25 ENCOUNTER — Encounter (HOSPITAL_COMMUNITY): Payer: Self-pay | Admitting: *Deleted

## 2013-10-25 ENCOUNTER — Ambulatory Visit (HOSPITAL_COMMUNITY): Payer: Worker's Compensation

## 2013-10-25 ENCOUNTER — Ambulatory Visit (HOSPITAL_COMMUNITY): Payer: Worker's Compensation | Admitting: *Deleted

## 2013-10-25 DIAGNOSIS — M5126 Other intervertebral disc displacement, lumbar region: Secondary | ICD-10-CM | POA: Diagnosis present

## 2013-10-25 DIAGNOSIS — Z6841 Body Mass Index (BMI) 40.0 and over, adult: Secondary | ICD-10-CM | POA: Insufficient documentation

## 2013-10-25 DIAGNOSIS — M4806 Spinal stenosis, lumbar region: Secondary | ICD-10-CM | POA: Insufficient documentation

## 2013-10-25 DIAGNOSIS — M545 Low back pain, unspecified: Secondary | ICD-10-CM

## 2013-10-25 HISTORY — PX: LUMBAR LAMINECTOMY/DECOMPRESSION MICRODISCECTOMY: SHX5026

## 2013-10-25 SURGERY — LUMBAR LAMINECTOMY/DECOMPRESSION MICRODISCECTOMY 1 LEVEL
Anesthesia: General | Site: Back | Laterality: Bilateral

## 2013-10-25 MED ORDER — SODIUM CHLORIDE 0.9 % IV SOLN: 250.0000 mL | INTRAVENOUS | Status: DC

## 2013-10-25 MED ORDER — DOCUSATE SODIUM 100 MG PO CAPS: 100.0000 mg | ORAL_CAPSULE | Freq: Two times a day (BID) | ORAL | Status: DC | PRN

## 2013-10-25 MED ORDER — FENTANYL CITRATE 0.05 MG/ML IJ SOLN
INTRAMUSCULAR | Status: AC
  Filled 2013-10-25: qty 5

## 2013-10-25 MED ORDER — METHOCARBAMOL 500 MG PO TABS: 500.0000 mg | ORAL_TABLET | Freq: Four times a day (QID) | ORAL | Status: DC | PRN

## 2013-10-25 MED ORDER — ONDANSETRON HCL 4 MG/2ML IJ SOLN
INTRAMUSCULAR | Status: DC | PRN
  Administered 2013-10-25: 4 mg via INTRAVENOUS

## 2013-10-25 MED ORDER — METHOCARBAMOL 500 MG PO TABS: 500.0000 mg | ORAL_TABLET | Freq: Three times a day (TID) | ORAL | Status: DC | PRN

## 2013-10-25 MED ORDER — PHENYLEPHRINE 40 MCG/ML (10ML) SYRINGE FOR IV PUSH (FOR BLOOD PRESSURE SUPPORT)
PREFILLED_SYRINGE | INTRAVENOUS | Status: AC
  Filled 2013-10-25: qty 10

## 2013-10-25 MED ORDER — HYDROMORPHONE HCL 1 MG/ML IJ SOLN
0.2500 mg | INTRAMUSCULAR | Status: DC | PRN
  Administered 2013-10-25 (×2): 0.5 mg via INTRAVENOUS

## 2013-10-25 MED ORDER — GLYCOPYRROLATE 0.2 MG/ML IJ SOLN
INTRAMUSCULAR | Status: AC
  Filled 2013-10-25: qty 3

## 2013-10-25 MED ORDER — ACETAMINOPHEN 650 MG RE SUPP: 650.0000 mg | RECTAL | Status: DC | PRN

## 2013-10-25 MED ORDER — BUPIVACAINE-EPINEPHRINE (PF) 0.5% -1:200000 IJ SOLN
INTRAMUSCULAR | Status: AC
  Filled 2013-10-25: qty 30

## 2013-10-25 MED ORDER — PHENYLEPHRINE HCL 10 MG/ML IJ SOLN
INTRAMUSCULAR | Status: DC | PRN
  Administered 2013-10-25 (×3): 40 ug via INTRAVENOUS
  Administered 2013-10-25: 80 ug via INTRAVENOUS
  Administered 2013-10-25 (×2): 40 ug via INTRAVENOUS

## 2013-10-25 MED ORDER — BUPIVACAINE-EPINEPHRINE 0.5% -1:200000 IJ SOLN
INTRAMUSCULAR | Status: DC | PRN
  Administered 2013-10-25: 16 mL

## 2013-10-25 MED ORDER — METOCLOPRAMIDE HCL 5 MG/ML IJ SOLN
INTRAMUSCULAR | Status: DC | PRN
  Administered 2013-10-25: 10 mg via INTRAVENOUS

## 2013-10-25 MED ORDER — THROMBIN 5000 UNITS EX SOLR
CUTANEOUS | Status: DC | PRN
  Administered 2013-10-25: 5000 [IU] via TOPICAL

## 2013-10-25 MED ORDER — FENTANYL CITRATE 0.05 MG/ML IJ SOLN
INTRAMUSCULAR | Status: DC | PRN
  Administered 2013-10-25 (×2): 50 ug via INTRAVENOUS
  Administered 2013-10-25: 100 ug via INTRAVENOUS

## 2013-10-25 MED ORDER — CEFAZOLIN SODIUM 10 G IJ SOLR
3.0000 g | INTRAMUSCULAR | Status: AC
  Administered 2013-10-25: 3 g via INTRAVENOUS
  Filled 2013-10-25: qty 3000

## 2013-10-25 MED ORDER — MEPERIDINE HCL 50 MG/ML IJ SOLN: 6.2500 mg | INTRAMUSCULAR | Status: DC | PRN

## 2013-10-25 MED ORDER — DOCUSATE SODIUM 100 MG PO CAPS
100.0000 mg | ORAL_CAPSULE | Freq: Two times a day (BID) | ORAL | Status: DC
  Administered 2013-10-25 – 2013-10-27 (×3): 100 mg via ORAL

## 2013-10-25 MED ORDER — THROMBIN 5000 UNITS EX SOLR
OROMUCOSAL | Status: DC | PRN
  Administered 2013-10-25: 12:00:00 via TOPICAL

## 2013-10-25 MED ORDER — PANTOPRAZOLE SODIUM 40 MG IV SOLR
40.0000 mg | Freq: Every day | INTRAVENOUS | Status: DC
  Administered 2013-10-25: 40 mg via INTRAVENOUS
  Filled 2013-10-25 (×3): qty 40

## 2013-10-25 MED ORDER — LACTATED RINGERS IV SOLN: INTRAVENOUS | Status: DC

## 2013-10-25 MED ORDER — SODIUM CHLORIDE 0.9 % IR SOLN
Status: DC | PRN
  Administered 2013-10-25: 12:00:00

## 2013-10-25 MED ORDER — ONDANSETRON HCL 4 MG/2ML IJ SOLN
4.0000 mg | INTRAMUSCULAR | Status: DC | PRN
  Administered 2013-10-25 – 2013-10-26 (×2): 4 mg via INTRAVENOUS
  Filled 2013-10-25 (×2): qty 2

## 2013-10-25 MED ORDER — ROCURONIUM BROMIDE 100 MG/10ML IV SOLN
INTRAVENOUS | Status: AC
  Filled 2013-10-25: qty 1

## 2013-10-25 MED ORDER — METOCLOPRAMIDE HCL 5 MG/ML IJ SOLN
INTRAMUSCULAR | Status: AC
  Filled 2013-10-25: qty 2

## 2013-10-25 MED ORDER — OXYCODONE-ACETAMINOPHEN 5-325 MG PO TABS
1.0000 | ORAL_TABLET | ORAL | Status: DC | PRN
  Administered 2013-10-26 (×2): 1 via ORAL
  Administered 2013-10-26 – 2013-10-27 (×2): 2 via ORAL
  Filled 2013-10-25: qty 1
  Filled 2013-10-25: qty 2
  Filled 2013-10-25: qty 1
  Filled 2013-10-25: qty 2

## 2013-10-25 MED ORDER — ALUM & MAG HYDROXIDE-SIMETH 200-200-20 MG/5ML PO SUSP: 30.0000 mL | Freq: Four times a day (QID) | ORAL | Status: DC | PRN

## 2013-10-25 MED ORDER — ROCURONIUM BROMIDE 100 MG/10ML IV SOLN
INTRAVENOUS | Status: DC | PRN
  Administered 2013-10-25: 50 mg via INTRAVENOUS
  Administered 2013-10-25 (×2): 10 mg via INTRAVENOUS

## 2013-10-25 MED ORDER — THROMBIN 5000 UNITS EX SOLR
CUTANEOUS | Status: AC
  Filled 2013-10-25: qty 10000

## 2013-10-25 MED ORDER — MENTHOL 3 MG MT LOZG: 1.0000 | LOZENGE | OROMUCOSAL | Status: DC | PRN

## 2013-10-25 MED ORDER — PHENOL 1.4 % MT LIQD: 1.0000 | OROMUCOSAL | Status: DC | PRN

## 2013-10-25 MED ORDER — KCL IN DEXTROSE-NACL 20-5-0.45 MEQ/L-%-% IV SOLN
INTRAVENOUS | Status: AC
  Administered 2013-10-25 – 2013-10-26 (×2): via INTRAVENOUS
  Filled 2013-10-25 (×2): qty 1000

## 2013-10-25 MED ORDER — PROMETHAZINE HCL 25 MG/ML IJ SOLN: 6.2500 mg | INTRAMUSCULAR | Status: DC | PRN

## 2013-10-25 MED ORDER — GLYCOPYRROLATE 0.2 MG/ML IJ SOLN
INTRAMUSCULAR | Status: DC | PRN
  Administered 2013-10-25: 0.6 mg via INTRAVENOUS

## 2013-10-25 MED ORDER — BISACODYL 5 MG PO TBEC: 5.0000 mg | DELAYED_RELEASE_TABLET | Freq: Every day | ORAL | Status: DC | PRN

## 2013-10-25 MED ORDER — CEFAZOLIN SODIUM-DEXTROSE 2-3 GM-% IV SOLR
2.0000 g | Freq: Three times a day (TID) | INTRAVENOUS | Status: AC
  Administered 2013-10-25 – 2013-10-26 (×3): 2 g via INTRAVENOUS
  Filled 2013-10-25 (×3): qty 50

## 2013-10-25 MED ORDER — METHOCARBAMOL 1000 MG/10ML IJ SOLN
500.0000 mg | Freq: Four times a day (QID) | INTRAVENOUS | Status: DC | PRN
  Administered 2013-10-25: 500 mg via INTRAVENOUS
  Filled 2013-10-25: qty 5

## 2013-10-25 MED ORDER — NEOSTIGMINE METHYLSULFATE 10 MG/10ML IV SOLN
INTRAVENOUS | Status: DC | PRN
  Administered 2013-10-25: 5 mg via INTRAVENOUS

## 2013-10-25 MED ORDER — MAGNESIUM CITRATE PO SOLN: 1.0000 | Freq: Once | ORAL | Status: AC | PRN

## 2013-10-25 MED ORDER — OXYCODONE HCL 5 MG PO TABS: 5.0000 mg | ORAL_TABLET | Freq: Once | ORAL | Status: DC | PRN

## 2013-10-25 MED ORDER — SODIUM CHLORIDE 0.9 % IR SOLN
Status: AC
  Filled 2013-10-25: qty 1

## 2013-10-25 MED ORDER — PROPOFOL 10 MG/ML IV BOLUS
INTRAVENOUS | Status: DC | PRN
  Administered 2013-10-25: 200 mg via INTRAVENOUS

## 2013-10-25 MED ORDER — SODIUM CHLORIDE 0.9 % IJ SOLN: 3.0000 mL | Freq: Two times a day (BID) | INTRAMUSCULAR | Status: DC

## 2013-10-25 MED ORDER — MIDAZOLAM HCL 2 MG/2ML IJ SOLN
INTRAMUSCULAR | Status: AC
  Filled 2013-10-25: qty 2

## 2013-10-25 MED ORDER — OXYCODONE HCL 5 MG/5ML PO SOLN
5.0000 mg | Freq: Once | ORAL | Status: DC | PRN
  Filled 2013-10-25: qty 5

## 2013-10-25 MED ORDER — HYDROMORPHONE HCL 1 MG/ML IJ SOLN
0.5000 mg | INTRAMUSCULAR | Status: DC | PRN
  Administered 2013-10-25: 1 mg via INTRAVENOUS
  Filled 2013-10-25: qty 1

## 2013-10-25 MED ORDER — ACETAMINOPHEN 325 MG PO TABS: 650.0000 mg | ORAL_TABLET | ORAL | Status: DC | PRN

## 2013-10-25 MED ORDER — PROPOFOL 10 MG/ML IV BOLUS
INTRAVENOUS | Status: AC
  Filled 2013-10-25: qty 20

## 2013-10-25 MED ORDER — SENNOSIDES-DOCUSATE SODIUM 8.6-50 MG PO TABS: 1.0000 | ORAL_TABLET | Freq: Every evening | ORAL | Status: DC | PRN

## 2013-10-25 MED ORDER — OXYCODONE-ACETAMINOPHEN 7.5-325 MG PO TABS: 1.0000 | ORAL_TABLET | ORAL | Status: DC | PRN

## 2013-10-25 MED ORDER — LACTATED RINGERS IV SOLN
INTRAVENOUS | Status: DC | PRN
  Administered 2013-10-25: 11:00:00 via INTRAVENOUS

## 2013-10-25 MED ORDER — HYDROCODONE-ACETAMINOPHEN 5-325 MG PO TABS
1.0000 | ORAL_TABLET | ORAL | Status: DC | PRN
  Administered 2013-10-25: 2 via ORAL
  Administered 2013-10-26 – 2013-10-27 (×2): 1 via ORAL
  Filled 2013-10-25 (×2): qty 2
  Filled 2013-10-25 (×2): qty 1

## 2013-10-25 MED ORDER — MIDAZOLAM HCL 5 MG/5ML IJ SOLN
INTRAMUSCULAR | Status: DC | PRN
  Administered 2013-10-25: 2 mg via INTRAVENOUS

## 2013-10-25 MED ORDER — ONDANSETRON HCL 4 MG/2ML IJ SOLN
INTRAMUSCULAR | Status: AC
  Filled 2013-10-25: qty 2

## 2013-10-25 MED ORDER — NEOSTIGMINE METHYLSULFATE 10 MG/10ML IV SOLN
INTRAVENOUS | Status: AC
  Filled 2013-10-25: qty 1

## 2013-10-25 MED ORDER — HYDROMORPHONE HCL 1 MG/ML IJ SOLN
INTRAMUSCULAR | Status: AC
  Administered 2013-10-25: 1 mg via INTRAVENOUS
  Filled 2013-10-25: qty 1

## 2013-10-25 MED ORDER — SODIUM CHLORIDE 0.9 % IJ SOLN
3.0000 mL | INTRAMUSCULAR | Status: DC | PRN
  Administered 2013-10-25: 3 mL via INTRAVENOUS

## 2013-10-25 SURGICAL SUPPLY — 45 items
BAG ZIPLOCK 12X15 (MISCELLANEOUS) IMPLANT
CLEANER TIP ELECTROSURG 2X2 (MISCELLANEOUS) ×3 IMPLANT
CLOSURE WOUND 1/2 X4 (GAUZE/BANDAGES/DRESSINGS) ×1
CLOTH 2% CHLOROHEXIDINE 3PK (PERSONAL CARE ITEMS) ×3 IMPLANT
DRAPE MICROSCOPE LEICA (MISCELLANEOUS) ×3 IMPLANT
DRAPE POUCH INSTRU U-SHP 10X18 (DRAPES) ×3 IMPLANT
DRAPE SURG 17X11 SM STRL (DRAPES) ×3 IMPLANT
DRAPE UTILITY XL STRL (DRAPES) ×3 IMPLANT
DRSG AQUACEL AG ADV 3.5X 4 (GAUZE/BANDAGES/DRESSINGS) ×3 IMPLANT
DRSG AQUACEL AG ADV 3.5X 6 (GAUZE/BANDAGES/DRESSINGS) IMPLANT
DURAPREP 26ML APPLICATOR (WOUND CARE) ×3 IMPLANT
DURASEAL SPINE SEALANT 3ML (MISCELLANEOUS) IMPLANT
ELECT BLADE TIP CTD 4 INCH (ELECTRODE) IMPLANT
ELECT REM PT RETURN 9FT ADLT (ELECTROSURGICAL) ×3
ELECTRODE REM PT RTRN 9FT ADLT (ELECTROSURGICAL) ×1 IMPLANT
GLOVE BIOGEL PI IND STRL 7.5 (GLOVE) ×1 IMPLANT
GLOVE BIOGEL PI INDICATOR 7.5 (GLOVE) ×2
GLOVE SURG SS PI 7.5 STRL IVOR (GLOVE) ×3 IMPLANT
GLOVE SURG SS PI 8.0 STRL IVOR (GLOVE) ×6 IMPLANT
GOWN STRL REUS W/TWL XL LVL3 (GOWN DISPOSABLE) ×6 IMPLANT
IV CATH 14GX2 1/4 (CATHETERS) IMPLANT
KIT BASIN OR (CUSTOM PROCEDURE TRAY) ×3 IMPLANT
KIT POSITIONING SURG ANDREWS (MISCELLANEOUS) ×3 IMPLANT
MANIFOLD NEPTUNE II (INSTRUMENTS) ×3 IMPLANT
NEEDLE SPNL 18GX3.5 QUINCKE PK (NEEDLE) ×6 IMPLANT
PACK LAMINECTOMY ORTHO (CUSTOM PROCEDURE TRAY) ×3 IMPLANT
PATTIES SURGICAL .5 X.5 (GAUZE/BANDAGES/DRESSINGS) IMPLANT
PATTIES SURGICAL .75X.75 (GAUZE/BANDAGES/DRESSINGS) ×3 IMPLANT
PATTIES SURGICAL 1X1 (DISPOSABLE) ×3 IMPLANT
SPONGE SURGIFOAM ABS GEL 100 (HEMOSTASIS) ×3 IMPLANT
STAPLER VISISTAT (STAPLE) IMPLANT
STRIP CLOSURE SKIN 1/2X4 (GAUZE/BANDAGES/DRESSINGS) ×2 IMPLANT
SUT NURALON 4 0 TR CR/8 (SUTURE) IMPLANT
SUT PROLENE 3 0 PS 2 (SUTURE) ×3 IMPLANT
SUT VIC AB 1 CT1 27 (SUTURE) ×2
SUT VIC AB 1 CT1 27XBRD ANTBC (SUTURE) ×1 IMPLANT
SUT VIC AB 1-0 CT2 27 (SUTURE) ×3 IMPLANT
SUT VIC AB 2-0 CT1 27 (SUTURE)
SUT VIC AB 2-0 CT1 TAPERPNT 27 (SUTURE) IMPLANT
SUT VIC AB 2-0 CT2 27 (SUTURE) ×3 IMPLANT
SYR 3ML LL SCALE MARK (SYRINGE) IMPLANT
TOWEL OR 17X26 10 PK STRL BLUE (TOWEL DISPOSABLE) ×3 IMPLANT
TOWEL OR NON WOVEN STRL DISP B (DISPOSABLE) IMPLANT
TRAY FOLEY CATH 14FRSI W/METER (CATHETERS) ×3 IMPLANT
YANKAUER SUCT BULB TIP NO VENT (SUCTIONS) IMPLANT

## 2013-10-25 NOTE — Anesthesia Postprocedure Evaluation (Signed)
Anesthesia Post Note  Patient: [name] [name]  Procedure(s) Performed: Procedure(s) (LRB): MICRO LUMBAR DECOMPRESSION L5-S1 BILATERAL (Bilateral)  Anesthesia type: General  Patient location: PACU  Post pain: Pain level controlled  Post assessment: Post-op Vital signs reviewed  Last Vitals: BP 101/44  Pulse 67  Temp(Src) 36.4 C (Oral)  Resp 16  Ht 5\' 6"  (1.676 m)  Wt 282 lb (127.914 kg)  BMI 45.54 kg/m2  SpO2 98%  Post vital signs: Reviewed  Level of consciousness: sedated  Complications: No apparent anesthesia complications

## 2013-10-25 NOTE — Op Note (Signed)
NAMRafael [name]:  [name] [name]              ACCOUNT NO.:  [id number]  MEDICAL RECORD NO.:  [id number]  LOCATION:  1614                         [facility]  PHYSICIAN:  Jene EveryJeffrey Ziana Heyliger, M.D.    DATE OF BIRTH:  12/12/1970  DATE OF PROCEDURE:  10/25/2013 DATE OF DISCHARGE:                              OPERATIVE REPORT   PREOPERATIVE DIAGNOSES:  Spinal stenosis, herniated nucleus pulposus at L4-5 bilaterally.  POSTOPERATIVE DIAGNOSES:  Spinal stenosis, herniated nucleus pulposus at L4-5 bilaterally.  PROCEDURES PERFORMED:  Microlumbar decompression at L5-S1 bilaterally; foraminotomies at L5 and S1 bilaterally; microdiscectomy at L5-S1. Technical difficulty increased due to the patient's elevated BMI, which is at 45.  ANESTHESIA:  General.  ASSISTANT:  Lanna PocheJacqueline Bissell, PA.  HISTORY:  A 43 year old with predominant right lower extremity radicular pain.  MRI indicating central disk with lateral recess stenosis, epidural lipomatosis.  She had positive neural tension signs bilaterally, refractory conservative treatment, indicated for lumbar decompression.  Given neural tension signs bilaterally, we discussed bilateral decompression, risk and benefits were discussed including bleeding, infection, damage to neurovascular structures, DVT, PE, anesthetic complications, etc.  TECHNIQUE:  With the patient in supine position, after induction of adequate general anesthesia, 3 g of Kefzol, she was placed prone on the [employer] frame.  All bony prominences were well padded.  Lumbar region was prepped and draped in usual sterile fashion.  Two 18-gauge spinal needle was utilized to localize the 5-1 interspace, confirmed with x- ray.  Incision was made from spinous process of 5-1.  Subcutaneous tissue was dissected.  Electrocautery was utilized to achieve hemostasis.  She had an ample subcutaneous adipose layer.  Dorsolumbar fascia was identified and divided on either side of the  interspinous ligament at 5-1.  We divided the paraspinous musculature, placed a self- retaining retractor.  Confirmatory radiograph was noted.  Attention was turned towards the most symptomatic side, which was the right hemilaminotomy of the caudad edge of 5 was performed, but 3-mm Kerrison detached the ligamentum flavum preserving the pars.  Straight curette was utilized to detach the ligamentum flavum from the cephalad edge of S1.  Foraminotomy of S1 was performed.  There was severe compression of the S1 nerve root into the lateral recess and into the foramen.  We decompressed the lateral recess to the medial border of the pedicle, gently mobilized the nerve root medially.  There was an epidural venous plexus, which was lysed and cauterized.  There was epidural lipomatosis noted as well and removed.  Small disk herniation was noted.  I performed an annulotomy and removed the disk herniation with a micropituitary.  There was no further small fragment that was noted. The predominance of the pathology was the S1 nerve root into the lateral recess.  The nerve root was erythematous and edematous.  Following the decompression and 1 cm excursion of the S1 nerve root at medial of the pedicle without tension.  Irrigated the disk space copiously with an antibiotic irrigation.  No evidence of CSF leakage or active bleeding. I turned our attention towards the left side.  In a similar fashion, performed the laminotomy, decompressed the lateral recess, performed foraminotomies of S1 and L5 as I did on the  right as well.  The nerve root was noted decompressed in the lateral recess as well.  There was epidural lipomatosis noted here as well.  This was excised.  No significant disk herniation was noted on the right.  There was an epidural venous plexus that was cauterized and lysed.  Following this, there was 1 cm of excursion of the S1 nerve root medial of the pedicle without tension and probe passed  freely at the foramen of L5 and S1.  We checked beneath the thecal sac bilaterally the shoulder and the axilla of the root as well as both foramen, no residual disk herniation.  Good restoration of the nerve roots were noted.  No evidence of CSF leakage or active bleeding.  Both wounds were copiously irrigated.  We obtained a confirmatory radiograph.  We removed the [facility] retractor, irrigated the paraspinous musculature, no active bleeding.  We closed the dorsolumbar fascia with 1 Vicryl and subcu with multiple 2-0 Vicryls.  We had to use the extended retractors and the case was increase in time due to the patient's elevated BMI, which was 45. Sterile dressing was applied after the closure was performed.  Placed supine on the hospital bed, extubated without difficulty, and transported to the recovery room in satisfactory condition.  The patient tolerated the procedure well.  No complications.  Assistant, Lanna PocheJacqueline Bissell, PA was used throughout the case for assisting, the patient positioning, gentle intermittent neural traction.     Jene EveryJeffrey Shaylon Gillean, M.D.     Cordelia PenJB/MEDQ  D:  10/25/2013  T:  10/25/2013  Job:  [id number]

## 2013-10-25 NOTE — Transfer of Care (Signed)
Immediate Anesthesia Transfer of Care Note  Patient: [name] [name]  Procedure(s) Performed: Procedure(s): MICRO LUMBAR DECOMPRESSION L5-S1 BILATERAL (Bilateral)  Patient Location: PACU  Anesthesia Type:General  Level of Consciousness: Patient easily awoken, sedated, comfortable, cooperative, following commands, responds to stimulation.   Airway & Oxygen Therapy: Patient spontaneously breathing, ventilating well, oxygen via simple oxygen mask.  Post-op Assessment: Report given to PACU RN, vital signs reviewed and stable, moving all extremities.   Post vital signs: Reviewed and stable.  Complications: No apparent anesthesia complications

## 2013-10-25 NOTE — Discharge Instructions (Signed)
Walk As Tolerated utilizing back precautions.  No bending, twisting, or lifting.  No driving for 2 weeks.   Aquacel dressing may remain in place until follow up. May shower with aquacel dressing in place. If the dressing falls off or becomes saturates, may remove aquacel dressing and place gauze and tape dressing which should be kept clean and dry and changed daily. Do not remove steri-strips if they are present. See Dr. Shelle IronBeane in office in 10 to 14 days. Begin taking aspirin 81mg  per day starting 4 days after your surgery if not allergic to aspirin or on another blood thinner. Walk daily even outside. Use a cane or walker only if necessary. Avoid sitting on soft sofas.

## 2013-10-25 NOTE — H&P (View-Only) (Signed)
[name] SnideShonda Laural [name] is an 43 y.o. female.   Chief Complaint: back and B/L leg pain HPI: The patient is a 43 year old female who presents today for follow up of their back. The patient is being followed for their back pain. They are now 4 1/2 months out from injury (DOI 05/25/2013). Symptoms reported today include: pain, weakness and leg pain (bilateral but the right is worse than the left). The patient states that they are doing poorly. Current treatment includes: activity modification and NSAIDs. The following medication has been used for pain control: ibuprofen. The patient reports their current pain level to be 8 / 10. The patient presents today following ESI right L5-S1 x 13 days. The patient reports the injection helped for 3 days. Note for "Follow-up back": The patient is currently out of work. NCM is [employer] Timberlake.  Past Medical History  Diagnosis Date  . Pneumonia     pt to ER 07/14/2013 for community acquired pneumonia pt denies having pneumonia     Past Surgical History  Procedure Laterality Date  . Tubal ligation      No family history on file. Social History:  reports that she has never smoked. She has never used smokeless tobacco. She reports that she does not drink alcohol or use illicit drugs.  Allergies: No Known Allergies   (Not in a hospital admission)  No results found for this or any previous visit (from the past 48 hour(s)). No results found.  Review of Systems  Constitutional: Negative.   HENT: Negative.   Eyes: Negative.   Respiratory: Negative.   Cardiovascular: Negative.   Gastrointestinal: Negative.   Genitourinary: Negative.   Musculoskeletal: Positive for back pain.  Skin: Negative.   Neurological: Negative.     Last menstrual period 10/17/2013. Physical Exam  Constitutional: She is oriented to person, place, and time. She appears well-developed and well-nourished. She appears distressed.  HENT:  Head: Normocephalic and atraumatic.  Eyes:  Conjunctivae and EOM are normal. Pupils are equal, round, and reactive to light.  Neck: Normal range of motion. Neck supple.  Cardiovascular: Normal rate and regular rhythm.   Respiratory: Effort normal and breath sounds normal.  GI: Soft. Bowel sounds are normal.  Musculoskeletal:  On exam, moderate distress. Mood and affect is appropriate. Walks with an antalgic gait. Straight leg raise on the right produces buttock, thigh, and calf pain and on the left buttock and thigh pain. Pain with extension and end flexion of the lumbar spine. Lumbar spine exam reveals no evidence of soft tissue swelling, ecchymosis or deformity. On palpation there is no flank pain with percussion. Nontender over the trochanters.   Neurological: She is alert and oriented to person, place, and time. She has normal reflexes.  Skin: Skin is warm and dry.  Psychiatric: She has a normal mood and affect.    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  Assessment/Plan 1. 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 Temporary relief from an epidural. 2. Obesity. 3. Facet arthrosis at 4-5.  We had extensive discussion with [name] [name] concerning current pathology, relevant anatomy, and treatment options. There are two options. One is to live with her symptoms at maximum medical improvement and impairment rating of 5% p.r.n. follow-up verses lumbar decompression on the right and probably on the left as well. She would have perhaps residual back pain related to the facet  arthritis at 4-5 and also disc degeneration at 5-1 and need to protect against loading the discs postoperatively. We specifically talked about reducing the risk for recurrent disc herniation. Overnight in the hospital. In two weeks suture removal. Therapy referral at six weeks for work simulation and at three months  maximum medical improvement. I gave her a prescription for Norco to be taken as directed. Continue with her current work status. Discussed this separately and in front of the patient with her case manager, [name]. No other options at this point in time.  I had an extensive discussion of the risks and benefits of the lumbar decompression with the patient including bleeding, infection, damage to neurovascular structures, epidural fibrosis, CSF leak requiring repair. We also discussed increase in pain, adjacent segment disease, recurrent disc herniation, need for future surgery including repeat decompression and/or fusion. We also discussed risks of postoperative hematoma, paralysis, anesthetic complications including DVT, PE, death, cardiopulmonary dysfunction. In addition, the perioperative and postoperative courses were discussed in detail including the rehabilitative time and return to functional activity and work. I provided the patient with an illustrated handout and utilized the appropriate surgical models.  Plan microlumbar decompression L5-S1 bilateral  Rayan Ines M. PA-C for Dr. Shelle IronBeane 10/23/2013, 12:33 PM

## 2013-10-25 NOTE — Brief Op Note (Signed)
10/25/2013  1:07 PM  PATIENT:  [name] [name]  43 y.o. female  PRE-OPERATIVE DIAGNOSIS:  HP L5-S1  POST-OPERATIVE DIAGNOSIS:  HP L5-S1  PROCEDURE:  Procedure(s): MICRO LUMBAR DECOMPRESSION L5-S1 BILATERAL (Bilateral)  SURGEON:  Surgeon(s) and Role:    * Javier DockerJeffrey C Ritaj Dullea, MD - Primary  PHYSICIAN ASSISTANT:   ASSISTANTS: Bissell   ANESTHESIA:   general  EBL:  Total I/O In: -  Out: 175 [Urine:150; Blood:25]  BLOOD ADMINISTERED:none  DRAINS: none   LOCAL MEDICATIONS USED:  MARCAINE     SPECIMEN:  No Specimen  DISPOSITION OF SPECIMEN:  N/A  COUNTS:  YES  TOURNIQUET:  * No tourniquets in log *  DICTATION: .Other Dictation: Dictation Number [id number]  PLAN OF CARE: Admit for overnight observation  PATIENT DISPOSITION:  PACU - hemodynamically stable.   Delay start of Pharmacological VTE agent (>24hrs) due to surgical blood loss or risk of bleeding: yes

## 2013-10-25 NOTE — Interval H&P Note (Signed)
History and Physical Interval Note:  10/25/2013 8:54 AM  [name] [name]  has presented today for surgery, with the diagnosis of HP L5-S1  The various methods of treatment have been discussed with the patient and family. After consideration of risks, benefits and other options for treatment, the patient has consented to  Procedure(s): MICRO LUMBAR DECOMPRESSION L5-S1 BILATERAL (Bilateral) as a surgical intervention .  [name] history has been reviewed, patient examined, no change in status, stable for surgery.  I have reviewed the patient's chart and labs.  Questions were answered to the patient's satisfaction.     Ollen Rao C

## 2013-10-25 NOTE — Anesthesia Preprocedure Evaluation (Signed)
Anesthesia Evaluation  Patient identified by MRN, date of birth, ID band Patient awake    Reviewed: Allergy & Precautions, H&P , NPO status , Patient's Chart, lab work & pertinent test results  Airway Mallampati: II TM Distance: >3 FB Neck ROM: Full    Dental no notable dental hx.    Pulmonary pneumonia -,  breath sounds clear to auscultation  Pulmonary exam normal       Cardiovascular negative cardio ROS  Rhythm:Regular Rate:Normal     Neuro/Psych negative neurological ROS  negative psych ROS   GI/Hepatic negative GI ROS, Neg liver ROS,   Endo/Other  negative endocrine ROS  Renal/GU negative Renal ROS     Musculoskeletal negative musculoskeletal ROS (+)   Abdominal   Peds  Hematology negative hematology ROS (+)   Anesthesia Other Findings   Reproductive/Obstetrics negative OB ROS                           Anesthesia Physical Anesthesia Plan  ASA: III  Anesthesia Plan: General   Post-op Pain Management:    Induction: Intravenous  Airway Management Planned: Oral ETT  Additional Equipment:   Intra-op Plan:   Post-operative Plan: Extubation in OR  Informed Consent: I have reviewed the patients History and Physical, chart, labs and discussed the procedure including the risks, benefits and alternatives for the proposed anesthesia with the patient or authorized representative who has indicated his/her understanding and acceptance.   Dental advisory given  Plan Discussed with: CRNA  Anesthesia Plan Comments:         Anesthesia Quick Evaluation

## 2013-10-26 DIAGNOSIS — M5126 Other intervertebral disc displacement, lumbar region: Secondary | ICD-10-CM | POA: Diagnosis not present

## 2013-10-26 MED ORDER — PROMETHAZINE HCL 25 MG/ML IJ SOLN
12.5000 mg | Freq: Four times a day (QID) | INTRAMUSCULAR | Status: DC | PRN
  Administered 2013-10-26: 12.5 mg via INTRAVENOUS
  Filled 2013-10-26: qty 1

## 2013-10-26 MED ORDER — PANTOPRAZOLE SODIUM 40 MG PO TBEC
40.0000 mg | DELAYED_RELEASE_TABLET | Freq: Every day | ORAL | Status: DC
Start: 2013-10-27 — End: 2013-10-27
  Administered 2013-10-27: 40 mg via ORAL
  Filled 2013-10-26 (×2): qty 1

## 2013-10-26 NOTE — Care Management Note (Signed)
    Page 1 of 2   10/26/2013     4:05:25 PM CARE MANAGEMENT NOTE 10/26/2013  Patient:  [name] [name],[name] [name]   Account Number:  [id number]  Date Initiated:  10/26/2013  Documentation initiated by:  Johnni Wunschel  Subjective/Objective Assessment:   outpt services no needs     Action/Plan:   home laster today   Anticipated DC Date:  10/26/2013   Anticipated DC Plan:  HOME/SELF CARE  In-house referral  NA      DC Planning Services  NA      [facility]   Choice offered to / List presented to:  NA   DME arranged  NA      DME agency  NA     HH arranged  NA      HH agency  NA   Status of service:  In process, will continue to follow Medicare Important Message given?   (If response is "NO", the following Medicare IM given date fields will be blank) Date Medicare IM given:   Medicare IM given by:   Date Additional Medicare IM given:   Additional Medicare IM given by:    Discharge Disposition:    Per UR Regulation:  Reviewed for med. necessity/level of care/duration of stay  If discussed at Long Length of Stay Meetings, dates discussed:    Comments:  [id number]/Lakeyshia Tuckerman,RN,BSN,CCM

## 2013-10-26 NOTE — Evaluation (Signed)
Physical Therapy Evaluation Patient Details Name: [name] BihariShonda [name] MRN: [id number] DOB: 09/14/1970 Today's Date: 10/26/2013   History of Present Illness  43 yo female s/p L5-S1 bilateral decompression 10/25/13  Clinical Impression  On eval, pt required Min assist for mobility-able to ambulate ~65 feet with rolling walker. Mobility limited by nausea and pain, especially in R LE with activity-pt rated 9/10. Will plan to check back to see pt a 2nd time today to assess progress. Pt may benefit from another day in hospital-made RN aware.     Follow Up Recommendations Home health PT;Supervision/Assistance - 24 hour    Equipment Recommendations  Rolling walker with 5" wheels    Recommendations for Other Services OT consult     Precautions / Restrictions Precautions Precautions: Back Precaution Comments: Reviewed back precautions. Pt only able to recall 1/3.  Restrictions Weight Bearing Restrictions: No      Mobility  Bed Mobility Overal bed mobility: Needs Assistance Bed Mobility: Rolling;Supine to Sit Rolling: Min assist   Supine to sit: Min assist     General bed mobility comments: pt sitting in reclilner  Transfers Overall transfer level: Needs assistance Equipment used: Rolling walker (2 wheeled) Transfers: Sit to/from Stand Sit to Stand: Min assist         General transfer comment: assist to rise, stabilize, control descent. VCs safety, technique, hand placement.   Ambulation/Gait Ambulation/Gait assistance: Min guard Ambulation Distance (Feet): 65 Feet Assistive device: Rolling walker (2 wheeled) Gait Pattern/deviations: Step-through pattern;Decreased stride length     General Gait Details: slow gait speed. close guard for safety, VCs safety, posture, distance from RW. Several standing rest breaks needed due to pain in R LE  Stairs            Wheelchair Mobility    Modified Rankin (Stroke Patients Only)       Balance                                              Pertinent Vitals/Pain Pain Assessment: 0-10 Pain Score: 6  Pain Location: back Pain Descriptors / Indicators: Discomfort Pain Intervention(s): Monitored during session;Premedicated before session    Home Living Family/patient expects to be discharged to:: Private residence Living Arrangements: Alone Available Help at Discharge:  (daughter) Type of Home: House Home Access: Stairs to enter   Secretary/administratorntrance Stairs-Number of Steps: 4 Home Layout: One level Home Equipment: None      Prior Function Level of Independence: Independent               Hand Dominance   Dominant Hand: Right    Extremity/Trunk Assessment   Upper Extremity Assessment: Defer to OT evaluation           Lower Extremity Assessment: RLE deficits/detail RLE Deficits / Details: Pt reports R LE pain, especially with activity    Cervical / Trunk Assessment: Normal  Communication   Communication: No difficulties  Cognition Arousal/Alertness: Awake/alert Behavior During Therapy: WFL for tasks assessed/performed Overall Cognitive Status: Within Functional Limits for tasks assessed                      General Comments      Exercises        Assessment/Plan    PT Assessment Patient needs continued PT services  PT Diagnosis Difficulty walking;Acute pain   PT Problem List Decreased strength;Decreased  range of motion;Decreased activity tolerance;Decreased balance;Decreased mobility;Obesity;Pain;Decreased knowledge of use of DME;Decreased knowledge of precautions  PT Treatment Interventions DME instruction;Gait training;Stair training;Functional mobility training;Therapeutic exercise;Therapeutic activities;Patient/family education;Balance training   PT Goals (Current goals can be found in the Care Plan section) Acute Rehab PT Goals Patient Stated Goal: less pain. home. PT Goal Formulation: With patient/family Time For Goal Achievement: 11/02/13 Potential  to Achieve Goals: Good    Frequency Min 5X/week   Barriers to discharge        Co-evaluation               End of Session   Activity Tolerance: Patient limited by pain (limited by nausea) Patient left: in chair;with call bell/phone within reach;with family/visitor present      Functional Assessment Tool Used: clinical judgement Functional Limitation: Mobility: Walking and moving around Mobility: Walking and Moving Around Current Status ([id number](G8978): At least 20 percent but less than 40 percent impaired, limited or restricted Mobility: Walking and Moving Around Goal Status [phone]): At least 1 percent but less than 20 percent impaired, limited or restricted    Time: 0933-0946 PT Time Calculation (min): 13 min   Charges:   PT Evaluation $Initial PT Evaluation Tier I: 1 Procedure PT Treatments $Gait Training: 8-22 mins   PT G Codes:   Functional Assessment Tool Used: clinical judgement Functional Limitation: Mobility: Walking and moving around    R.R. DonnelleyJannie Nyriah [name], MPT Pager: [phone]

## 2013-10-26 NOTE — Progress Notes (Signed)
Subjective: 1 Day Post-Op Procedure(s) (LRB): MICRO LUMBAR DECOMPRESSION L5-S1 BILATERAL (Bilateral) Patient reports pain as moderate.   C/o pain and nausea. Seen by Dr. Shelle IronBeane earlier today.  Objective: Vital signs in last 24 hours: Temp:  [97.4 F (36.3 C)-98.8 F (37.1 C)] 98.8 F (37.1 C) (10/15 0925) Pulse Rate:  [58-88] 66 (10/15 0925) Resp:  [14-24] 16 (10/15 0925) BP: (101-148)/(44-82) 117/82 mmHg (10/15 0925) SpO2:  [97 %-100 %] 98 % (10/15 0925) Weight:  [127.914 kg (282 lb)] 127.914 kg (282 lb) (10/14 1455)  Intake/Output from previous day: 10/14 0701 - 10/15 0700 In: 2863.8 [P.O.:820; I.V.:1943.8; IV Piggyback:100] Out: 1635 [Urine:1210; Emesis/NG output:400; Blood:25] Intake/Output this shift: Total I/O In: -  Out: 800 [Urine:500; Emesis/NG output:300]  No results found for this basename: HGB,  in the last 72 hours No results found for this basename: WBC, RBC, HCT, PLT,  in the last 72 hours No results found for this basename: NA, K, CL, CO2, BUN, CREATININE, GLUCOSE, CALCIUM,  in the last 72 hours No results found for this basename: LABPT, INR,  in the last 72 hours  Neurologically intact ABD soft Neurovascular intact Sensation intact distally Intact pulses distally Dorsiflexion/Plantar flexion intact Incision: dressing C/D/I and no drainage No cellulitis present Compartment soft no calf pain or sign of DVT  Assessment/Plan: 1 Day Post-Op Procedure(s) (LRB): MICRO LUMBAR DECOMPRESSION L5-S1 BILATERAL (Bilateral) Advance diet Up with therapy D/C IV fluids Adjust pain meds and anti-emetics Possible D/C later today if improving otherwise plan for D/C tomorrow  [name] GrimeBISSELL, [name] [name]. 10/26/2013, 12:49 PM

## 2013-10-26 NOTE — Evaluation (Signed)
Occupational Therapy Evaluation Patient Details Name: [name] BihariShonda [name] MRN: [id number] DOB: 02-Nov-1970 Today's Date: 10/26/2013    History of Present Illness s/p spinal surgery HNP   Clinical Impression   Patient is s/p spinal surgery resulting in the deficits listed below (see OT Problem List). Patient will benefit from skilled OT to increase their safety and independence with ADL and functional mobility for ADL (while adhering to their precautions) to facilitate discharge to venue listed below.      Follow Up Recommendations  Home health OT    Equipment Recommendations  Tub/shower seat;3 in 1 bedside comode    Recommendations for Other Services PT consult     Precautions / Restrictions Precautions Precautions: Back      Mobility Bed Mobility Overal bed mobility: Needs Assistance Bed Mobility: Rolling;Supine to Sit Rolling: Min assist   Supine to sit: Min assist     General bed mobility comments: verbal cues for technique and back precautions  Transfers Overall transfer level: Needs assistance Equipment used: Rolling walker (2 wheeled) Transfers: Sit to/from Stand Sit to Stand: Min assist              Balance                                            ADL Overall ADL's : Needs assistance/impaired Eating/Feeding: Sitting;Set up   Grooming: Sitting;Set up   Upper Body Bathing: Minimal assitance;Sitting   Lower Body Bathing: Maximal assistance;Sit to/from stand;Cueing for back precautions   Upper Body Dressing : Minimal assistance;Sitting   Lower Body Dressing: Sit to/from stand;Maximal assistance;Cueing for back precautions   Toilet Transfer: Minimal assistance;Ambulation;Comfort height toilet;Cueing for safety;Cueing for sequencing   Toileting- Clothing Manipulation and Hygiene: Maximal assistance;Sit to/from stand       Functional mobility during ADLs: Minimal assistance;Rolling walker;Cueing for safety                  Pertinent Vitals/Pain Pain Assessment: 0-10 Pain Score: 6  Pain Descriptors / Indicators: Discomfort Pain Intervention(s): Limited activity within patient's tolerance;Monitored during session;Repositioned;Premedicated before session     Hand Dominance Right   Extremity/Trunk Assessment Upper Extremity Assessment Upper Extremity Assessment: Generalized weakness           Communication Communication Communication: No difficulties   Cognition Arousal/Alertness: Awake/alert Behavior During Therapy: WFL for tasks assessed/performed Overall Cognitive Status: Within Functional Limits for tasks assessed                     General Comments   pt became sick and threw up after sitting in the chair. Nursing called            Home Living Family/patient expects to be discharged to:: Private residence Living Arrangements: Alone Available Help at Discharge: Family Type of Home: House Home Access: Stairs to enter Secretary/administratorntrance Stairs-Number of Steps: 4   Home Layout: One level     Bathroom Shower/Tub: Chief Strategy OfficerTub/shower unit   Bathroom Toilet: Standard     Home Equipment: None          Prior Functioning/Environment Level of Independence: Independent             OT Diagnosis: Generalized weakness;Acute pain   OT Problem List: Decreased activity tolerance;Pain;Decreased knowledge of precautions   OT Treatment/Interventions: Self-care/ADL training;DME and/or AE instruction;Patient/family education    OT Goals(Current goals can be found in  the care plan section) Acute Rehab OT Goals Patient Stated Goal: get home and be I OT Goal Formulation: With patient Time For Goal Achievement: 11/09/13 Potential to Achieve Goals: Good  OT Frequency: Min 2X/week    End of Session Equipment Utilized During Treatment: Rolling walker Nurse Communication: Mobility status  Activity Tolerance: Patient tolerated treatment well Patient left: in chair;with call bell/phone within  reach;with family/visitor present   Time: 0830-0911 OT Time Calculation (min): 41 min Charges:  OT General Charges $OT Visit: 1 Procedure OT Evaluation $Initial OT Evaluation Tier I: 1 Procedure OT Treatments $Self Care/Home Management : 23-37 mins G-Codes: OT G-codes **NOT FOR INPATIENT CLASS** Functional Assessment Tool Used: clinical observation Functional Limitation: Self care Self Care Current Status ([id number](G8987): At least 20 percent but less than 40 percent impaired, limited or restricted Self Care Goal Status ([id number](G8988): At least 1 percent but less than 20 percent impaired, limited or restricted  [name] [name], Metro KungLorraine D 10/26/2013, 9:16 AM

## 2013-10-26 NOTE — Progress Notes (Signed)
Physical Therapy Treatment Patient Details Name: [name] [name] MRN: [id number] DOB: [birth year] Today's Date: 10/26/2013    History of Present Illness 43 yo female s/p L5-S1 bilateral decompression 10/25/13    PT Comments    Progressing slowly with mobility. Pt tolerated 2nd session much better than 1st. Rates R leg pain as 6/10 and back pain as 5/10. Plan is for d/c home on tomorrow. Will plan to have another session of PT prior to d/c.   Follow Up Recommendations  Home health PT;Supervision - Intermittent     Equipment Recommendations  Rolling walker with 5" wheels    Recommendations for Other Services OT consult     Precautions / Restrictions Precautions Precautions: Back Precaution Comments: Reviewed back precations. Pt only able to recall 1/3.  Restrictions Weight Bearing Restrictions: No    Mobility  Bed Mobility Overal bed mobility: Needs Assistance Bed Mobility: Rolling;Sidelying to Sit;Sit to Sidelying Rolling: Supervision Sidelying to sit: Min assist     Sit to sidelying: Min guard General bed mobility comments: Assist for trunk to upright. Increased time.   Transfers Overall transfer level: Needs assistance Equipment used: Rolling walker (2 wheeled) Transfers: Sit to/from Stand Sit to Stand: Min assist         General transfer comment: assist to rise, stabilize, control descent. VCs safety, technique, hand placement.   Ambulation/Gait Ambulation/Gait assistance: Min guard Ambulation Distance (Feet): 75 Feet Assistive device: Rolling walker (2 wheeled) Gait Pattern/deviations: Step-through pattern;Decreased stride length     General Gait Details: slow gait speed. close guard for safety, VCs safety, posture, distance from RW.    Stairs Stairs: Yes Stairs assistance: Min assist Stair Management: Step to pattern Number of Stairs: 2 General stair comments: up and over portable steps with use of handrails. VCs safety, technique, sequence. assist  to stabilize at times  Wheelchair Mobility    Modified Rankin (Stroke Patients Only)       Balance                                    Cognition Arousal/Alertness: Awake/alert Behavior During Therapy: WFL for tasks assessed/performed Overall Cognitive Status: Within Functional Limits for tasks assessed                      Exercises      General Comments        Pertinent Vitals/Pain Pain Assessment: 0-10 Pain Score: 6  Pain Location: 6-R leg, 5-back Pain Descriptors / Indicators: Sore;Radiating Pain Intervention(s): Monitored during session    Home Living                      Prior Function            PT Goals (current goals can now be found in the care plan section) Progress towards PT goals: Progressing toward goals    Frequency  Min 5X/week    PT Plan Current plan remains appropriate    Co-evaluation             End of Session   Activity Tolerance: Patient limited by pain Patient left: in bed;with call bell/phone within reach     Time: [id number] PT Time Calculation (min): 18 min  Charges:  $Gait Training: 8-22 mins                    G Codes:  [name] [name], MPT Pager: [phone]

## 2013-10-26 NOTE — Progress Notes (Signed)
Patient has received and eaten her full liquid diet. Currently tolerating well. Will continue to monitor

## 2013-10-26 NOTE — Progress Notes (Signed)
OT Cancellation Note  Patient Details Name: [name] [name] MRN: [id number] DOB: Sep 23, 1970   Cancelled Treatment:    Reason Eval/Treat Not Completed: Pain limiting ability to participate Checked on pt for 2nd OT session and pt declined as she was working on getting pain under control.    Alba CoryREDDING, [name] D 10/26/2013, 12:14 PM

## 2013-10-26 NOTE — Progress Notes (Signed)
Previous nurse reported patient had n/v earlier. Have not had any more n/v since 1500. Patient has order a full liquid tray. Will assess her tolerance to liquids. MD made aware.

## 2013-10-27 ENCOUNTER — Encounter (HOSPITAL_COMMUNITY): Payer: Self-pay | Admitting: Specialist

## 2013-10-27 DIAGNOSIS — Z6841 Body Mass Index (BMI) 40.0 and over, adult: Secondary | ICD-10-CM | POA: Diagnosis not present

## 2013-10-27 DIAGNOSIS — M5126 Other intervertebral disc displacement, lumbar region: Secondary | ICD-10-CM | POA: Diagnosis not present

## 2013-10-27 DIAGNOSIS — M4806 Spinal stenosis, lumbar region: Secondary | ICD-10-CM | POA: Diagnosis not present

## 2013-10-27 MED ORDER — OXYCODONE-ACETAMINOPHEN 5-325 MG PO TABS: 1.0000 | ORAL_TABLET | ORAL | Status: DC | PRN

## 2013-10-27 NOTE — Progress Notes (Addendum)
Subjective: 2 Days Post-Op Procedure(s) (LRB): MICRO LUMBAR DECOMPRESSION L5-S1 BILATERAL (Bilateral) Patient reports pain as moderate.  Nausea improved. Fatigued this morning. Still noting back and leg pain but states slightly improved since yesterday.  Objective: Vital signs in last 24 hours: Temp:  [98.4 F (36.9 C)-99.5 F (37.5 C)] 98.4 F (36.9 C) (10/16 0557) Pulse Rate:  [66-105] 105 (10/16 0557) Resp:  [16] 16 (10/16 0557) BP: (117-132)/(60-82) 132/70 mmHg (10/16 0557) SpO2:  [91 %-98 %] 91 % (10/16 0557)  Intake/Output from previous day: 10/15 0701 - 10/16 0700 In: 480 [P.O.:480] Out: 2725 [Urine:2425; Emesis/NG output:300] Intake/Output this shift:    No results found for this basename: HGB,  in the last 72 hours No results found for this basename: WBC, RBC, HCT, PLT,  in the last 72 hours No results found for this basename: NA, K, CL, CO2, BUN, CREATININE, GLUCOSE, CALCIUM,  in the last 72 hours No results found for this basename: LABPT, INR,  in the last 72 hours  Neurologically intact ABD soft Neurovascular intact Sensation intact distally Intact pulses distally Dorsiflexion/Plantar flexion intact Incision: dressing C/D/I and no drainage No cellulitis present Compartment soft no calf pain or sign of DVT  Assessment/Plan: 2 Days Post-Op Procedure(s) (LRB): MICRO LUMBAR DECOMPRESSION L5-S1 BILATERAL (Bilateral) Advance diet Up with therapy D/C IV fluids Possible D/C later today depending on her pain level/progress with PT Will discuss with Dr. Elissa LovettBeane  BISSELL, JACLYN M. 10/27/2013, 7:57 AM Pt tachycardic with PT ok  Rest. Asym. EKG NSR. OK for D/C . instr given. 2:24 PM

## 2013-10-27 NOTE — Discharge Summary (Signed)
Physician Discharge Summary   Patient ID: [name] [name] MRN: [id number] DOB/AGE: 04/05/1970 43 y.o.  Admit date: 10/25/2013 Discharge date: 10/27/2013  Primary Diagnosis:   HP L5-S1  Admission Diagnoses:  Past Medical History  Diagnosis Date  . Pneumonia     pt to ER 07/14/2013 for community acquired pneumonia pt denies having pneumonia    Discharge Diagnoses:   Active Problems:   HNP (herniated nucleus pulposus), lumbar  Procedure:  Procedure(s) (LRB): MICRO LUMBAR DECOMPRESSION L5-S1 BILATERAL (Bilateral)   Consults: None  HPI:  see H&P    Laboratory Data: Hospital Outpatient Visit on 10/17/2013  Component Date Value Ref Range Status  . Sodium 10/17/2013 135* 137 - 147 mEq/L Final  . Potassium 10/17/2013 4.4  3.7 - 5.3 mEq/L Final  . Chloride 10/17/2013 100  96 - 112 mEq/L Final  . CO2 10/17/2013 25  19 - 32 mEq/L Final  . Glucose, Bld 10/17/2013 104* 70 - 99 mg/dL Final  . BUN 10/17/2013 9  6 - 23 mg/dL Final  . Creatinine, Ser 10/17/2013 0.95  0.50 - 1.10 mg/dL Final  . Calcium 10/17/2013 8.9  8.4 - 10.5 mg/dL Final  . GFR calc non Af Amer 10/17/2013 71* >90 mL/min Final  . GFR calc Af Amer 10/17/2013 83* >90 mL/min Final   Comment: (NOTE)                          The eGFR has been calculated using the CKD EPI equation.                          This calculation has not been validated in all clinical situations.                          eGFR's persistently <90 mL/min signify possible Chronic Kidney                          Disease.  . Anion gap 10/17/2013 10  5 - 15 Final  . WBC 10/17/2013 5.9  4.0 - 10.5 K/uL Final  . RBC 10/17/2013 4.51  3.87 - 5.11 MIL/uL Final  . Hemoglobin 10/17/2013 9.0* 12.0 - 15.0 g/dL Final  . HCT 10/17/2013 30.2* 36.0 - 46.0 % Final  . MCV 10/17/2013 67.0* 78.0 - 100.0 fL Final  . MCH 10/17/2013 20.0* 26.0 - 34.0 pg Final  . MCHC 10/17/2013 29.8* 30.0 - 36.0 g/dL Final  . RDW 10/17/2013 19.9* 11.5 - 15.5 % Final  . Platelets  10/17/2013 311  150 - 400 K/uL Final  . Preg, Serum 10/17/2013 NEGATIVE  NEGATIVE Final   Comment:                                 THE SENSITIVITY OF THIS                          METHODOLOGY IS >10 mIU/mL.   No results found for this basename: HGB,  in the last 72 hours No results found for this basename: WBC, RBC, HCT, PLT,  in the last 72 hours No results found for this basename: NA, K, CL, CO2, BUN, CREATININE, GLUCOSE, CALCIUM,  in the last 72 hours No results found for this basename: LABPT, INR,  in the last  72 hours  X-Rays:Dg Chest 2 View  10/17/2013   CLINICAL DATA:  Preoperative lumbar decompression; pneumonia 3 months prior  EXAM: CHEST  2 VIEW  COMPARISON:  Chest radiograph and chest CT July 14, 2013  FINDINGS: Lungs are clear. Heart size and pulmonary vascularity are normal. No adenopathy. No bone lesions.  IMPRESSION: No abnormality noted.   Electronically Signed   By: Lowella Grip M.D.   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   Electronically Signed   By: Marin Olp M.D.   On: 10/25/2013 11:37   10/25/2013   CLINICAL DATA:  Preop lumbar decompression. Low back pain with right lower extremity radiculopathy 5 months.  EXAM: LUMBAR SPINE - 2-3 VIEW  COMPARISON:  06/12/2013  FINDINGS: Vertebral body alignment and heights are normal. There is minimal spondylosis over the lower lumbar spine. There is mild disc space narrowing at the L5-S1 level unchanged. Facet arthropathy is present over the mid to lower lumbar spine. There is no compression fracture or subluxation.  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  Electronically Signed: By: Marin Olp M.D. On: 10/17/2013 15:58   Dg Spine Portable 1 View  10/25/2013   CLINICAL DATA:  43 year old female undergoing lumbar surgery. Initial encounter.  EXAM: PORTABLE SPINE - 1  VIEW  COMPARISON:  1204 hr the same day and earlier.  FINDINGS: Intraoperative portable cross-table lateral view of the lumbar spine at 1215 hrs. Surgical probe now the L5-S1 disc space level.  IMPRESSION: Intraoperative localization  at L5-S1.   Electronically Signed   By: Lars Pinks M.D.   On: 10/25/2013 12:48   Dg Spine Portable 1 View  10/25/2013   CLINICAL DATA:  Low back pain.  Mid drop per planning.  EXAM: PORTABLE SPINE - 1 VIEW  COMPARISON:  One-view lumbar spine from the same day.  FINDINGS: The spinous cross the is are numbered from T12 through S1. A needle is directed the L1 spinous process. A second needle is directed at the S1 spinous process.  IMPRESSION: Intraoperative localization of L1 and S1.   Electronically Signed   By: Lawrence Santiago M.D.   On: 10/25/2013 12:20   Dg Spine Portable 1 View  10/25/2013   CLINICAL DATA:  For lumbar spine surgery  EXAM: PORTABLE SPINE - 1 VIEW  COMPARISON:  Preop films of 10/17/2013  FINDINGS: Two needles are positioned posteriorly. One needle is directed toward the superior aspect of the spinous process of L3 with the second directed toward the superior aspect of the spinous process of L4 posteriorly.  IMPRESSION: Two needles for localization directed toward the posterior spinous processes of L3 and L4.   Electronically Signed   By: Ivar Drape M.D.   On: 10/25/2013 12:12    EKG: Orders placed during the hospital encounter of 10/25/13  . EKG 12-LEAD  . EKG 12-LEAD  . EKG 12-LEAD  . EKG 12-LEAD  . EKG 12-LEAD  . EKG 12-LEAD     Hospital Course: Patient was admitted to [facility] and taken to the OR and underwent the above state procedure without complications.  Patient tolerated the procedure well and was later transferred to the recovery room and then to the orthopaedic floor for postoperative care.  They were given PO and IV analgesics for pain control following their surgery.  They were  given 24 hours of postoperative antibiotics.    PT was consulted postop to assist with mobility and transfers.  The patient was allowed to be WBAT with therapy and was taught back precautions. Discharge planning was consulted to help with postop disposition and equipment needs.  Patient had a good night on the evening of surgery and started to get up OOB with therapy on day one. Patient was seen in rounds daily but pain was not well controlled enough for D/C on POD ! And she was nauseous. This improved through her 2nd POD and was ready to go home on day two. She did have asymptomatic tachycardia when up with PT, resolved with rest, had an EKG with normal sinus rhythm prior to D/C. They were given discharge instructions and dressing directions.  They were instructed on when to follow up in the office with Dr. Tonita Cong.   Diet: Regular diet Activity:WBAT; Lspine precautions Follow-up:in 10-14 days Disposition - Home Discharged Condition: good       Discharge Instructions   Call MD / Call 911    Complete by:  As directed   If you experience chest pain or shortness of breath, CALL 911 and be transported to the hospital emergency room.  If you develope a fever above 101 F, pus (white drainage) or increased drainage or redness at the wound, or calf pain, call your surgeon's office.     Constipation Prevention    Complete by:  As directed   Drink plenty of fluids.  Prune juice may be helpful.  You may use a stool softener, such as Colace (over the counter) 100 mg twice a day.  Use MiraLax (over the counter) for constipation as needed.     Diet - low sodium heart healthy    Complete by:  As directed      Increase activity slowly as tolerated    Complete by:  As directed             Medication List    STOP taking these medications       HYDROcodone-acetaminophen 7.5-325 MG per tablet  Commonly known as:  [place] these medications       docusate sodium 100 MG capsule  Commonly known as:  COLACE  Take 1 capsule (100 mg total) by  mouth 2 (two) times daily as needed for mild constipation.     ibuprofen 200 MG tablet  Commonly known as:  ADVIL,MOTRIN  Take 400 mg by mouth once as needed for headache or mild pain.     methocarbamol 500 MG tablet  Commonly known as:  ROBAXIN  Take 1 tablet (500 mg total) by mouth 3 (three) times daily between meals as needed for muscle spasms.     oxyCODONE-acetaminophen 7.5-325 MG per tablet  Commonly known as:  PERCOCET  Take 1-2 tablets by mouth every 4 (four) hours as needed for pain.       Follow-up Information   Follow up with Labarron Durnin C, MD In 2 weeks.   Specialty:  Orthopedic Surgery   Contact information:   [address] [place] [address] [phone]       Signed: Lacie Draft, PA-C Orthopaedic Surgery 10/27/2013, 4:58 PM

## 2013-10-27 NOTE — Plan of Care (Signed)
Problem: Discharge Progression Outcomes Goal: Barriers To Progression Addressed/Resolved Outcome: Completed/Met Date Met:  10/27/13 Nausea resolved

## 2013-10-27 NOTE — Progress Notes (Signed)
Physical Therapy Treatment Patient Details Name: Rafael BihariShonda Arruda MRN: [id number] DOB: 1970/01/30 Today's Date: 10/27/2013    History of Present Illness 43 yo female s/p L5-S1 bilateral decompression 10/25/13    PT Comments    Improved performance and tolerance for activity on today. Pt was able to walk ~150 feet with rolling walker. Continues to report moderate pain 7/10 in R LE with activity. HR up to 150s during ambulation-RN made aware. Discussed car transfer. Reviewed stair negotiation technique (up with good leg 1st, down with bad leg 1st)-pt practiced steps on yesterday. Ready to d/c from PT standpoint. Recommend HHPT, RW.   Follow Up Recommendations  Home health PT;Supervision - Intermittent     Equipment Recommendations  Rolling walker with 5" wheels    Recommendations for Other Services OT consult     Precautions / Restrictions Precautions Precautions: Back Precaution Comments: reviewed back precautions - pt able to recall 3/3 Restrictions Weight Bearing Restrictions: No    Mobility  Bed Mobility          General bed mobility comments: pt in chair-finishing OT session  Transfers Overall transfer level: Needs assistance Equipment used: Rolling walker (2 wheeled) Transfers: Sit to/from Stand Sit to Stand: Supervision         General transfer comment: VCS safety, hand placement  Ambulation/Gait Ambulation/Gait assistance: Supervision Ambulation Distance (Feet): 150 Feet Assistive device: Rolling walker (2 wheeled) Gait Pattern/deviations: Step-through pattern;Decreased stride length     General Gait Details: RLE pain with  ambulating-7/10 per pt. VCS safety.   Stairs            Wheelchair Mobility    Modified Rankin (Stroke Patients Only)       Balance                                    Cognition Arousal/Alertness: Awake/alert Behavior During Therapy: WFL for tasks assessed/performed Overall Cognitive Status: Within  Functional Limits for tasks assessed                      Exercises      General Comments        Pertinent Vitals/Pain Pain Assessment: 0-10 Pain Score: 7  Pain Location: R LE with activity Pain Descriptors / Indicators: Sore Pain Intervention(s): Monitored during session;Limited activity within patient's tolerance    Home Living                      Prior Function            PT Goals (current goals can now be found in the care plan section) Progress towards PT goals: Progressing toward goals    Frequency  Min 5X/week    PT Plan Current plan remains appropriate    Co-evaluation             End of Session   Activity Tolerance: Patient tolerated treatment well Patient left: in chair;with call bell/phone within reach (made RN aware of increased HR with activity)     Time: [id number] PT Time Calculation (min): 14 min  Charges:  $Gait Training: 8-22 mins                    G Codes:  Functional Assessment Tool Used: clinical judgement Functional Limitation: Mobility: Walking and moving around Mobility: Walking and Moving Around Current Status [phone]): At least 1 percent but less than  20 percent impaired, limited or restricted Mobility: Walking and Moving Around Goal Status [phone]): At least 1 percent but less than 20 percent impaired, limited or restricted Mobility: Walking and Moving Around Discharge Status [phone]): At least 1 percent but less than 20 percent impaired, limited or restricted   Weston Anna, MPT Pager: [phone]

## 2013-10-27 NOTE — Discharge Summary (Signed)
Physician Discharge Summary   Patient ID: [name] [name] MRN: [id number] DOB/AGE: Jun 18, 1970 43 y.o.  Admit date: 10/25/2013 Discharge date: 10/27/2013  Primary Diagnosis:   HP L5-S1  Admission Diagnoses:  Past Medical History  Diagnosis Date  . Pneumonia     pt to ER 07/14/2013 for community acquired pneumonia pt denies having pneumonia    Discharge Diagnoses:   Active Problems:   HNP (herniated nucleus pulposus), lumbar  Procedure:  Procedure(s) (LRB): MICRO LUMBAR DECOMPRESSION L5-S1 BILATERAL (Bilateral)   Consults: None  HPI:  see H&P    Laboratory Data: Hospital Outpatient Visit on 10/17/2013  Component Date Value Ref Range Status  . Sodium 10/17/2013 135* 137 - 147 mEq/L Final  . Potassium 10/17/2013 4.4  3.7 - 5.3 mEq/L Final  . Chloride 10/17/2013 100  96 - 112 mEq/L Final  . CO2 10/17/2013 25  19 - 32 mEq/L Final  . Glucose, Bld 10/17/2013 104* 70 - 99 mg/dL Final  . BUN 10/17/2013 9  6 - 23 mg/dL Final  . Creatinine, Ser 10/17/2013 0.95  0.50 - 1.10 mg/dL Final  . Calcium 10/17/2013 8.9  8.4 - 10.5 mg/dL Final  . GFR calc non Af Amer 10/17/2013 71* >90 mL/min Final  . GFR calc Af Amer 10/17/2013 83* >90 mL/min Final   Comment: (NOTE)                          The eGFR has been calculated using the CKD EPI equation.                          This calculation has not been validated in all clinical situations.                          eGFR's persistently <90 mL/min signify possible Chronic Kidney                          Disease.  . Anion gap 10/17/2013 10  5 - 15 Final  . WBC 10/17/2013 5.9  4.0 - 10.5 K/uL Final  . RBC 10/17/2013 4.51  3.87 - 5.11 MIL/uL Final  . Hemoglobin 10/17/2013 9.0* 12.0 - 15.0 g/dL Final  . HCT 10/17/2013 30.2* 36.0 - 46.0 % Final  . MCV 10/17/2013 67.0* 78.0 - 100.0 fL Final  . MCH 10/17/2013 20.0* 26.0 - 34.0 pg Final  . MCHC 10/17/2013 29.8* 30.0 - 36.0 g/dL Final  . RDW 10/17/2013 19.9* 11.5 - 15.5 % Final  . Platelets  10/17/2013 311  150 - 400 K/uL Final  . Preg, Serum 10/17/2013 NEGATIVE  NEGATIVE Final   Comment:                                 THE SENSITIVITY OF THIS                          METHODOLOGY IS >10 mIU/mL.   No results found for this basename: HGB,  in the last 72 hours No results found for this basename: WBC, RBC, HCT, PLT,  in the last 72 hours No results found for this basename: NA, K, CL, CO2, BUN, CREATININE, GLUCOSE, CALCIUM,  in the last 72 hours No results found for this basename: LABPT, INR,  in the last  72 hours  X-Rays:Dg Chest 2 View  10/17/2013   CLINICAL DATA:  Preoperative lumbar decompression; pneumonia 3 months prior  EXAM: CHEST  2 VIEW  COMPARISON:  Chest radiograph and chest CT July 14, 2013  FINDINGS: Lungs are clear. Heart size and pulmonary vascularity are normal. No adenopathy. No bone lesions.  IMPRESSION: No abnormality noted.   Electronically Signed   By: Lowella Grip M.D.   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   Electronically Signed   By: Marin Olp M.D.   On: 10/25/2013 11:37   10/25/2013   CLINICAL DATA:  Preop lumbar decompression. Low back pain with right lower extremity radiculopathy 5 months.  EXAM: LUMBAR SPINE - 2-3 VIEW  COMPARISON:  06/12/2013  FINDINGS: Vertebral body alignment and heights are normal. There is minimal spondylosis over the lower lumbar spine. There is mild disc space narrowing at the L5-S1 level unchanged. Facet arthropathy is present over the mid to lower lumbar spine. There is no compression fracture or subluxation.  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  Electronically Signed: By: Marin Olp M.D. On: 10/17/2013 15:58   Dg Spine Portable 1 View  10/25/2013   CLINICAL DATA:  43 year old female undergoing lumbar surgery. Initial encounter.  EXAM: PORTABLE SPINE - 1  VIEW  COMPARISON:  1204 hr the same day and earlier.  FINDINGS: Intraoperative portable cross-table lateral view of the lumbar spine at 1215 hrs. Surgical probe now the L5-S1 disc space level.  IMPRESSION: Intraoperative localization  at L5-S1.   Electronically Signed   By: Lars Pinks M.D.   On: 10/25/2013 12:48   Dg Spine Portable 1 View  10/25/2013   CLINICAL DATA:  Low back pain.  Mid drop per planning.  EXAM: PORTABLE SPINE - 1 VIEW  COMPARISON:  One-view lumbar spine from the same day.  FINDINGS: The spinous cross the is are numbered from T12 through S1. A needle is directed the L1 spinous process. A second needle is directed at the S1 spinous process.  IMPRESSION: Intraoperative localization of L1 and S1.   Electronically Signed   By: Lawrence Santiago M.D.   On: 10/25/2013 12:20   Dg Spine Portable 1 View  10/25/2013   CLINICAL DATA:  For lumbar spine surgery  EXAM: PORTABLE SPINE - 1 VIEW  COMPARISON:  Preop films of 10/17/2013  FINDINGS: Two needles are positioned posteriorly. One needle is directed toward the superior aspect of the spinous process of L3 with the second directed toward the superior aspect of the spinous process of L4 posteriorly.  IMPRESSION: Two needles for localization directed toward the posterior spinous processes of L3 and L4.   Electronically Signed   By: Ivar Drape M.D.   On: 10/25/2013 12:12    EKG: Orders placed during the hospital encounter of 10/25/13  . EKG 12-LEAD  . EKG 12-LEAD  . EKG 12-LEAD  . EKG 12-LEAD  . EKG 12-LEAD  . EKG 12-LEAD     Hospital Course: Patient was admitted to [facility] and taken to the OR and underwent the above state procedure without complications.  Patient tolerated the procedure well and was later transferred to the recovery room and then to the orthopaedic floor for postoperative care.  They were given PO and IV analgesics for pain control following their surgery.  They were  given 24 hours of postoperative antibiotics.    PT was consulted postop to assist with mobility and transfers.  The patient was allowed to be WBAT with therapy and was taught back precautions. Discharge planning was consulted to help with postop disposition and equipment needs.  Patient had a good night on the evening of surgery and started to get up OOB with therapy on day one. Patient was seen in rounds daily but pain was not well controlled enough for D/C on POD ! And she was nauseous. This improved through her 2nd POD and was ready to go home on day two. She did have asymptomatic tachycardia when up with PT, resolved with rest, had an EKG with normal sinus rhythm prior to D/C. They were given discharge instructions and dressing directions.  They were instructed on when to follow up in the office with Dr. Tonita Cong.   Diet: Regular diet Activity:WBAT; Lspine precautions Follow-up:in 10-14 days Disposition - Home Discharged Condition: good   Discharge Instructions   Call MD / Call 911    Complete by:  As directed   If you experience chest pain or shortness of breath, CALL 911 and be transported to the hospital emergency room.  If you develope a fever above 101 F, pus (white drainage) or increased drainage or redness at the wound, or calf pain, call your surgeon's office.     Constipation Prevention    Complete by:  As directed   Drink plenty of fluids.  Prune juice may be helpful.  You may use a stool softener, such as Colace (over the counter) 100 mg twice a day.  Use MiraLax (over the counter) for constipation as needed.     Diet - low sodium heart healthy    Complete by:  As directed      Increase activity slowly as tolerated    Complete by:  As directed             Medication List    STOP taking these medications       HYDROcodone-acetaminophen 7.5-325 MG per tablet  Commonly known as:  [place] these medications       docusate sodium 100 MG capsule  Commonly known as:  COLACE  Take 1 capsule (100 mg total) by mouth 2  (two) times daily as needed for mild constipation.     ibuprofen 200 MG tablet  Commonly known as:  ADVIL,MOTRIN  Take 400 mg by mouth once as needed for headache or mild pain.     methocarbamol 500 MG tablet  Commonly known as:  ROBAXIN  Take 1 tablet (500 mg total) by mouth 3 (three) times daily between meals as needed for muscle spasms.     oxyCODONE-acetaminophen 7.5-325 MG per tablet  Commonly known as:  PERCOCET  Take 1-2 tablets by mouth every 4 (four) hours as needed for pain.           Follow-up Information   Follow up with BEANE,JEFFREY C, MD In 2 weeks.   Specialty:  Orthopedic Surgery   Contact information:   [address] [place] [address] [phone]       Signed: Lacie Draft, PA-C Orthopaedic Surgery 10/27/2013, 4:02 PM

## 2013-10-27 NOTE — Progress Notes (Signed)
Occupational Therapy Treatment Patient Details Name: [name] BihariShonda [name] MRN: [id number] DOB: March 19, 1970 Today's Date: 10/27/2013    History of present illness 43 yo female s/p L5-S1 bilateral decompression 10/25/13   OT comments  Pt improved this day     Equipment Recommendations  Tub/shower seat;3 in 1 bedside comode       Precautions / Restrictions Precautions Precautions: Back Precaution Comments: reviewed back precautions - pt able to recall 2/3       Mobility Bed Mobility   Bed Mobility: Rolling Rolling: Supervision Sidelying to sit: Supervision          Transfers Overall transfer level: Needs assistance Equipment used: Rolling walker (2 wheeled) Transfers: Sit to/from Stand Sit to Stand: Supervision         General transfer comment: verbal cues to push up with hands and not pull on walker getting off toilet        ADL                           Toilet Transfer: Retail bankerupervision/safety;Regular Toilet;Ambulation;Cueing for safety;Comfort height toilet;Grab bars   Toileting- Clothing Manipulation and Hygiene: Supervision/safety;Sit to/from stand Toileting - Clothing Manipulation Details (indicate cue type and reason): verbal cues for technique to wipe and follow back precautions     Functional mobility during ADLs: Supervision/safety;Rolling walker General ADL Comments: After toileting pt wanted to eat breakfast prior to finishing OT session                Cognition   Behavior During [facility]: [facility] for tasks assessed/performed Overall Cognitive Status: Within Functional Limits for tasks assessed                               General Comments  pt needed increased time for ADL activity    Pertinent Vitals/ Pain       Pain Score: 4  Pain Descriptors / Indicators: Sore Pain Intervention(s): Monitored during session     Prior Functioning/Environment              Frequency Min 2X/week     Progress Toward Goals  OT  Goals(current goals can now be found in the care plan section)  Progress towards OT goals: Progressing toward goals     Plan Discharge plan remains appropriate       End of Session Equipment Utilized During Treatment: Rolling walker   Activity Tolerance Patient tolerated treatment well   Patient Left in chair;with call bell/phone within reach   Nurse Communication      Functional Assessment Tool Used: clinical observation Functional Limitation: Self care Self Care Current Status (M0102(G8987): At least 20 percent but less than 40 percent impaired, limited or restricted Self Care Goal Status (V2536(G8988): At least 1 percent but less than 20 percent impaired, limited or restricted   Time: 0800-0825 OT Time Calculation (min): 25 min  Charges: OT G-codes **NOT FOR INPATIENT CLASS** Functional Assessment Tool Used: clinical observation Functional Limitation: Self care Self Care Current Status (U4403(G8987): At least 20 percent but less than 40 percent impaired, limited or restricted Self Care Goal Status (K7425(G8988): At least 1 percent but less than 20 percent impaired, limited or restricted OT General Charges $OT Visit: 1 Procedure OT Treatments $Self Care/Home Management : 23-37 mins  [name] [name], Metro KungLorraine D 10/27/2013, 9:35 AM

## 2013-10-27 NOTE — Progress Notes (Signed)
Occupational Therapy Treatment Patient Details Name: [name] BihariShonda [name] MRN: [id number] DOB: January 16, 1970 Today's Date: 10/27/2013    History of present illness 43 yo female s/p L5-S1 bilateral decompression 10/25/13   OT comments  Pts HR 120 with standing bathing.  RN aware     Equipment Recommendations  Tub/shower seat;3 in 1 bedside comode       Precautions / Restrictions Precautions Precautions: Back Precaution Comments: reviewed back precautions - pt able to recall 3/3       Mobility Bed Mobility   Bed Mobility: Rolling Rolling: Supervision Sidelying to sit: Supervision       General bed mobility comments: pt in chair  Transfers Overall transfer level: Needs assistance Equipment used: Rolling walker (2 wheeled) Transfers: Sit to/from Stand Sit to Stand: Supervision         General transfer comment: verbal cues to push up with hands and not pull on walker getting off toilet        ADL           Upper Body Bathing: Set up;Sitting   Lower Body Bathing: Sit to/from stand;Supervison/ safety;Cueing for back precautions   Upper Body Dressing : Set up;Sitting   Lower Body Dressing: Cueing for back precautions;Minimal assistance;Sit to/from stand;With adaptive equipment Lower Body Dressing Details (indicate cue type and reason): issued pt AE as pt is workers comp.  Pt demonstrated understand of AE and was thankful for AE Toilet Transfer: Supervision/safety;Regular Toilet;Ambulation;Cueing for safety;Comfort height toilet;Grab bars   Toileting- Clothing Manipulation and Hygiene: Supervision/safety;Sit to/from stand Toileting - Clothing Manipulation Details (indicate cue type and reason): verbal cues for technique to wipe and follow back precautions     Functional mobility during ADLs: Supervision/safety;Rolling walker General ADL Comments: Pt performed entire bathing and dressing session with OT.  Pt fatigued post session but was pleased she was able to  perform.  Pt did well with AE and thankful for the AE option to increase her I                Cognition   Behavior During Therapy: WFL for tasks assessed/performed Overall Cognitive Status: Within Functional Limits for tasks assessed                                    Pertinent Vitals/ Pain       Pain Score: 4  Pain Location: back Pain Descriptors / Indicators: Sore Pain Intervention(s): Monitored during session         Frequency Min 2X/week     Progress Toward Goals  OT Goals(current goals can now be found in the care plan section)  Progress towards OT goals: Progressing toward goals     Plan Discharge plan remains appropriate       End of Session Equipment Utilized During Treatment: Rolling walker   Activity Tolerance Patient tolerated treatment well   Patient Left in chair;with call bell/phone within reach   Nurse Communication      Functional Assessment Tool Used: clinical observation Functional Limitation: Self care Self Care Current Status (Y8657(G8987): At least 1 percent but less than 20 percent impaired, limited or restricted Self Care Goal Status (Q4696(G8988): 0 percent impaired, limited or restricted   Time: [id number] OT Time Calculation (min): 34 min  Charges: OT G-codes **NOT FOR INPATIENT CLASS** Functional Assessment Tool Used: clinical observation Functional Limitation: Self care Self Care Current Status (K4401(G8987): At least 1 percent but less than  20 percent impaired, limited or restricted Self Care Goal Status (B1478(G8988): 0 percent impaired, limited or restricted OT General Charges $OT Visit: 1 Procedure OT Treatments $Self Care/Home Management : 23-37 mins  Syeda Prickett, Metro KungLorraine D 10/27/2013, 9:40 AM

## 2013-10-27 NOTE — Progress Notes (Signed)
Pt HR 150 when ambulating with Pt.  HR decreased to 120 within of rest in chair.  HR 105 @ 1020am.  PA notified.

## 2014-03-02 NOTE — Op Note (Signed)
NAMRafael Bihari:  Costantino, Ardella              ACCOUNT NO.:  [id number]  MEDICAL RECORD NO.:  [id number]  LOCATION:  1614                         [facility]  PHYSICIAN:  Jene EveryJeffrey Elana Jian, M.D.    DATE OF BIRTH:  02/10/1970  DATE OF PROCEDURE:  03/01/2014 DATE OF DISCHARGE:  10/27/2013                              OPERATIVE REPORT   ADDENDUM:  PREOPERATIVE DIAGNOSES:  Spinal stenosis, herniated nucleus pulposus at L5-S1 bilaterally.  POSTOPERATIVE DIAGNOSES:  Spinal stenosis, herniated nucleus pulposus at L5-S1 bilaterally.  PROCEDURE PERFORMED:  Same and that is after reviewing her chart from a previous dictation.  I attempted to do that within the epic myself, but was unable to add it by retrograde evaluation.     Jene EveryJeffrey Zada Haser, M.D.     Cordelia PenJB/MEDQ  D:  03/01/2014  T:  03/02/2014  Job:  [id number]

## 2014-03-04 ENCOUNTER — Emergency Department (HOSPITAL_BASED_OUTPATIENT_CLINIC_OR_DEPARTMENT_OTHER): Payer: Self-pay

## 2014-03-04 ENCOUNTER — Emergency Department (HOSPITAL_BASED_OUTPATIENT_CLINIC_OR_DEPARTMENT_OTHER)
Admission: EM | Admit: 2014-03-04 | Discharge: 2014-03-04 | Disposition: A | Discharge status: Home / Self Care | Payer: Self-pay | Attending: Emergency Medicine | Admitting: Emergency Medicine

## 2014-03-04 ENCOUNTER — Encounter (HOSPITAL_BASED_OUTPATIENT_CLINIC_OR_DEPARTMENT_OTHER): Payer: Self-pay | Admitting: *Deleted

## 2014-03-04 DIAGNOSIS — R739 Hyperglycemia, unspecified: Secondary | ICD-10-CM | POA: Insufficient documentation

## 2014-03-04 DIAGNOSIS — R0789 Other chest pain: Secondary | ICD-10-CM | POA: Insufficient documentation

## 2014-03-04 DIAGNOSIS — Z8701 Personal history of pneumonia (recurrent): Secondary | ICD-10-CM | POA: Insufficient documentation

## 2014-03-04 DIAGNOSIS — D649 Anemia, unspecified: Secondary | ICD-10-CM | POA: Insufficient documentation

## 2014-03-04 LAB — CBC WITH DIFFERENTIAL/PLATELET
Basophils Absolute: 0 10*3/uL (ref 0.0–0.1)
Basophils Relative: 0 % (ref 0–1)
EOS ABS: 0.1 10*3/uL (ref 0.0–0.7)
Eosinophils Relative: 2 % (ref 0–5)
HCT: 28.5 % — ABNORMAL LOW (ref 36.0–46.0)
Hemoglobin: 8.3 g/dL — ABNORMAL LOW (ref 12.0–15.0)
Lymphocytes Relative: 22 % (ref 12–46)
Lymphs Abs: 1.5 10*3/uL (ref 0.7–4.0)
MCH: 19.3 pg — ABNORMAL LOW (ref 26.0–34.0)
MCHC: 29.1 g/dL — AB (ref 30.0–36.0)
MCV: 66.1 fL — ABNORMAL LOW (ref 78.0–100.0)
MONOS PCT: 6 % (ref 3–12)
Monocytes Absolute: 0.4 10*3/uL (ref 0.1–1.0)
NEUTROS PCT: 71 % (ref 43–77)
Neutro Abs: 4.8 10*3/uL (ref 1.7–7.7)
PLATELETS: 338 10*3/uL (ref 150–400)
RBC: 4.31 MIL/uL (ref 3.87–5.11)
RDW: 20.4 % — ABNORMAL HIGH (ref 11.5–15.5)
WBC: 6.9 10*3/uL (ref 4.0–10.5)

## 2014-03-04 LAB — COMPREHENSIVE METABOLIC PANEL
ALBUMIN: 3.7 g/dL (ref 3.5–5.2)
ALK PHOS: 71 U/L (ref 39–117)
ALT: 10 U/L (ref 0–35)
AST: 22 U/L (ref 0–37)
Anion gap: 5 (ref 5–15)
BILIRUBIN TOTAL: 0.4 mg/dL (ref 0.3–1.2)
BUN: 11 mg/dL (ref 6–23)
CALCIUM: 8.4 mg/dL (ref 8.4–10.5)
CO2: 23 mmol/L (ref 19–32)
Chloride: 109 mmol/L (ref 96–112)
Creatinine, Ser: 1 mg/dL (ref 0.50–1.10)
GFR calc Af Amer: 78 mL/min — ABNORMAL LOW (ref >90)
GFR calc non Af Amer: 67 mL/min — ABNORMAL LOW (ref >90)
Glucose, Bld: 161 mg/dL — ABNORMAL HIGH (ref 70–99)
Potassium: 3.5 mmol/L (ref 3.5–5.1)
Sodium: 137 mmol/L (ref 135–145)
Total Protein: 7.9 g/dL (ref 6.0–8.3)

## 2014-03-04 LAB — TROPONIN I: Troponin I: <0.03 ng/mL (ref <0.031)

## 2014-03-04 MED ORDER — KETOROLAC TROMETHAMINE 30 MG/ML IJ SOLN
30.0000 mg | Freq: Once | INTRAMUSCULAR | Status: AC
  Administered 2014-03-04: 30 mg via INTRAVENOUS
  Filled 2014-03-04: qty 1

## 2014-03-04 NOTE — ED Notes (Signed)
Pain comes and goes, some numbness in left arm and hand

## 2014-03-04 NOTE — ED Notes (Signed)
Patient reports L side chest pain that started approx one hour ago, no SOB, no meds taken

## 2014-03-04 NOTE — Discharge Instructions (Signed)
As discussed, your evaluation today has been largely reassuring.  But, it is important that you monitor your condition carefully, and do not hesitate to return to the ED if you develop new, or concerning changes in your condition.  However, there are several findings that require additional evaluation and management by a primary care physician.

## 2014-03-04 NOTE — ED Provider Notes (Signed)
CSN: [id number]     Arrival date & time 03/04/14  1142 History   First MD Initiated Contact with Patient 03/04/14 1159     Chief Complaint  Patient presents with  . Chest Pain     HPI  Patient presents with CP. Onset was one hour pta. Since onset pain has resolved almost entirely, spontaneously. The pain was anterior, left upper chest, nonradiating, with no associated dyspnea.  Patient denies dyspnea, lightheadedness or syncope. Patient also complains of new left arm, hand numbness.  Onset of this symptom is unclear, seemingly within the last day. Patient denies substantial medical issues. Patient was in her usual state of health prior to the onset of symptoms. Patient does not smoke, does not drink.    Past Medical History  Diagnosis Date  . Pneumonia     pt to ER 07/14/2013 for community acquired pneumonia pt denies having pneumonia    Past Surgical History  Procedure Laterality Date  . Tubal ligation    . Lumbar laminectomy/decompression microdiscectomy Bilateral 10/25/2013    Procedure: MICRO LUMBAR DECOMPRESSION L5-S1 BILATERAL;  Surgeon: Javier Docker, MD;  Location: [facility];  Service: Orthopedics;  Laterality: Bilateral;   History reviewed. No pertinent family history. History  Substance Use Topics  . Smoking status: Never Smoker   . Smokeless tobacco: Never Used  . Alcohol Use: Yes   OB History    No data available     Review of Systems  Constitutional:       Per HPI, otherwise negative  HENT:       Per HPI, otherwise negative  Respiratory:       Per HPI, otherwise negative  Cardiovascular:       Per HPI, otherwise negative  Gastrointestinal: Negative for vomiting.  Endocrine:       Negative aside from HPI  Genitourinary:       Neg aside from HPI   Musculoskeletal:       Per HPI, otherwise negative  Skin: Negative.   Neurological: Positive for numbness. Negative for syncope and weakness.      Allergies  Review of patient's allergies indicates  no known allergies.  Home Medications   Prior to Admission medications   Medication Sig Start Date End Date Taking? Authorizing Provider  HYDROcodone-acetaminophen (NORCO) 7.5-325 MG per tablet Take 1 tablet by mouth every 6 (six) hours as needed for moderate pain.   Yes Historical Provider, MD  docusate sodium (COLACE) 100 MG capsule Take 1 capsule (100 mg total) by mouth 2 (two) times daily as needed for mild constipation. 10/25/13   Javier Docker, MD  ibuprofen (ADVIL,MOTRIN) 200 MG tablet Take 400 mg by mouth once as needed for headache or mild pain.    Historical Provider, MD  methocarbamol (ROBAXIN) 500 MG tablet Take 1 tablet (500 mg total) by mouth 3 (three) times daily between meals as needed for muscle spasms. 10/25/13   Javier Docker, MD  oxyCODONE-acetaminophen (PERCOCET) 7.5-325 MG per tablet Take 1-2 tablets by mouth every 4 (four) hours as needed for pain. 10/25/13   Javier Docker, MD   BP 145/96 mmHg  Pulse 99  Temp(Src) 98.3 F (36.8 C) (Oral)  Resp 20  Ht  (1.651 m)  Wt 270 lb (122.471 kg)  BMI 44.93 kg/m2  SpO2 100%  LMP 02/11/2014 Physical Exam  Constitutional: She is oriented to person, place, and time. She appears well-developed and well-nourished. No distress.  Morbidly obese F resting in bed  HENT:  Head: Normocephalic and atraumatic.  Eyes: Conjunctivae and EOM are normal.  Cardiovascular: Normal rate and regular rhythm.   Pulmonary/Chest: Effort normal and breath sounds normal. No stridor. No respiratory distress.    Abdominal: She exhibits no distension.  Musculoskeletal: She exhibits no edema.  Neurological: She is alert and oriented to person, place, and time. No cranial nerve deficit.  Skin: Skin is warm and dry.  Psychiatric: She has a normal mood and affect.  Nursing note and vitals reviewed.   ED Course  Procedures (including critical care time) Labs Review Labs Reviewed  COMPREHENSIVE METABOLIC PANEL - Abnormal; Notable for the  following:    Glucose, Bld 161 (*)    GFR calc non Af Amer 67 (*)    GFR calc Af Amer 78 (*)    All other components within normal limits  CBC WITH DIFFERENTIAL/PLATELET - Abnormal; Notable for the following:    Hemoglobin 8.3 (*)    HCT 28.5 (*)    MCV 66.1 (*)    MCH 19.3 (*)    MCHC 29.1 (*)    RDW 20.4 (*)    All other components within normal limits  TROPONIN I    Imaging Review Dg Chest 2 View  03/04/2014   CLINICAL DATA:  Left side chest pain, left arm numbness  EXAM: CHEST  2 VIEW  COMPARISON:  10/17/2013  FINDINGS: Cardiomediastinal silhouette is stable. No acute infiltrate or pleural effusion. No pulmonary edema. Bony thorax is unremarkable.  IMPRESSION: No active cardiopulmonary disease.   Electronically Signed   By: Natasha Mead M.D.   On: 03/04/2014 13:02   Ct Head Wo Contrast  03/04/2014   CLINICAL DATA:  Left side chest pain, left arm numbness  EXAM: CT HEAD WITHOUT CONTRAST  TECHNIQUE: Contiguous axial images were obtained from the base of the skull through the vertex without intravenous contrast.  COMPARISON:  None.  FINDINGS: No skull fracture is noted. Paranasal sinuses and mastoid air cells are unremarkable.  No intracranial hemorrhage, mass effect or midline shift.  No acute cortical infarction. No mass lesion is noted on this unenhanced scan.  IMPRESSION: No acute intracranial abnormality.   Electronically Signed   By: Natasha Mead M.D.   On: 03/04/2014 13:01     EKG Interpretation   Date/Time:  Sunday March 04 2014 11:46:17 EST Ventricular Rate:  87 PR Interval:  142 QRS Duration: 76 QT Interval:  334 QTC Calculation: 401 R Axis:   -23 Text Interpretation:  Sinus rhythm with frequent Premature ventricular  complexes Minimal voltage criteria for LVH, may be normal variant  Borderline ECG Sinus rhythm Premature ventricular complexes Artifact  Borderline ECG Confirmed by Gerhard Munch  MD ([id number]) on 03/04/2014  11:59:52 AM     3:18 PM Patient in no  distress. I discussed the patient's lab findings with her. Patient seems to acknowledge a history of anemia, but is not taking any medication for this, nor has she had any recent evaluation for it.   3:18 PM Patient in no distress, sleeping. We again discussed all findings, and the need for ongoing eval w primary care. Patient will be provided referral to one of our affiliates. MDM   Patient presents after a brief episode of CP. Here she is awake and alert, in no distress, and Sx seem to resolve completely, as the patient was sleeping on re-check. Low suspicion for pneumonia given the absence of cough or fever. Low suspicion for ACS given the reassuring labs. Low  suspicion for other acute pathology. Patient does have evidence for anemia, hyperglycemia. Patient will follow up with his primary care physician for further evaluation, management.    Gerhard Munchobert Skylah Delauter, MD 03/04/14 1520

## 2015-01-15 ENCOUNTER — Encounter (HOSPITAL_BASED_OUTPATIENT_CLINIC_OR_DEPARTMENT_OTHER): Payer: Self-pay | Admitting: *Deleted

## 2015-01-15 DIAGNOSIS — Z8701 Personal history of pneumonia (recurrent): Secondary | ICD-10-CM | POA: Insufficient documentation

## 2015-01-15 DIAGNOSIS — Z87828 Personal history of other (healed) physical injury and trauma: Secondary | ICD-10-CM | POA: Insufficient documentation

## 2015-01-15 DIAGNOSIS — E663 Overweight: Secondary | ICD-10-CM | POA: Insufficient documentation

## 2015-01-15 DIAGNOSIS — M5431 Sciatica, right side: Secondary | ICD-10-CM | POA: Insufficient documentation

## 2015-01-15 DIAGNOSIS — Y998 Other external cause status: Secondary | ICD-10-CM | POA: Insufficient documentation

## 2015-01-15 DIAGNOSIS — W228XXA Striking against or struck by other objects, initial encounter: Secondary | ICD-10-CM | POA: Insufficient documentation

## 2015-01-15 DIAGNOSIS — Z9889 Other specified postprocedural states: Secondary | ICD-10-CM | POA: Insufficient documentation

## 2015-01-15 DIAGNOSIS — S91201A Unspecified open wound of right great toe with damage to nail, initial encounter: Secondary | ICD-10-CM | POA: Insufficient documentation

## 2015-01-15 DIAGNOSIS — Y9289 Other specified places as the place of occurrence of the external cause: Secondary | ICD-10-CM | POA: Insufficient documentation

## 2015-01-15 DIAGNOSIS — Z9181 History of falling: Secondary | ICD-10-CM | POA: Insufficient documentation

## 2015-01-15 DIAGNOSIS — Y93E1 Activity, personal bathing and showering: Secondary | ICD-10-CM | POA: Insufficient documentation

## 2015-01-15 NOTE — ED Notes (Signed)
Back pain. Last cortisone shot was in July. She also wants to have her right great toe looked at. She hit her toe getting out of the tub and her toenail bent backward.

## 2015-01-16 ENCOUNTER — Emergency Department (HOSPITAL_BASED_OUTPATIENT_CLINIC_OR_DEPARTMENT_OTHER)
Admission: EM | Admit: 2015-01-16 | Discharge: 2015-01-16 | Disposition: A | Discharge status: Home / Self Care | Payer: Self-pay | Attending: Emergency Medicine | Admitting: Emergency Medicine

## 2015-01-16 DIAGNOSIS — M5431 Sciatica, right side: Secondary | ICD-10-CM

## 2015-01-16 MED ORDER — METHYLPREDNISOLONE 4 MG PO TBPK: ORAL_TABLET | ORAL | Status: DC

## 2015-01-16 MED ORDER — HYDROCODONE-ACETAMINOPHEN 5-325 MG PO TABS
1.0000 | ORAL_TABLET | Freq: Once | ORAL | Status: AC
  Administered 2015-01-16: 1 via ORAL
  Filled 2015-01-16: qty 1

## 2015-01-16 MED ORDER — HYDROCODONE-ACETAMINOPHEN 5-325 MG PO TABS: 1.0000 | ORAL_TABLET | Freq: Four times a day (QID) | ORAL | Status: DC | PRN

## 2015-01-16 MED ORDER — KETOROLAC TROMETHAMINE 60 MG/2ML IM SOLN
30.0000 mg | Freq: Once | INTRAMUSCULAR | Status: AC
  Administered 2015-01-16: 30 mg via INTRAMUSCULAR
  Filled 2015-01-16: qty 2

## 2015-01-16 NOTE — Discharge Instructions (Signed)

## 2015-01-16 NOTE — ED Provider Notes (Signed)
CSN: [id number]     Arrival date & time 01/15/15  2243 History   First MD Initiated Contact with Patient 01/16/15 0246     Chief Complaint  Patient presents with  . Back Pain     (Consider location/radiation/quality/duration/timing/severity/associated sxs/prior Treatment) HPI  Patient presents with back pain.  History of back surgery over 1 year ago and subsequent fall with reinjury over 6 months ago.   States "I'm supposed to have surgery again."  Receives injections for pain.  Last injection in July.  Takes NSAIDs at home with minimal relief.  States pain is in lower back and radiates RLE.  Worsened over the last several days.  Unable to see physician.  Denies new injury. Denies bowel or bladder dysfunction or weakness/numbness of the lower extremities.  Denies fever or drug use.  ALso noted earlier this evening that her right big toe nail appeared to be coming off.  It is not painful and she is unsure of any specific injury.  Past Medical History  Diagnosis Date  . Pneumonia     pt to ER 07/14/2013 for community acquired pneumonia pt denies having pneumonia    Past Surgical History  Procedure Laterality Date  . Tubal ligation    . Lumbar laminectomy/decompression microdiscectomy Bilateral 10/25/2013    Procedure: MICRO LUMBAR DECOMPRESSION L5-S1 BILATERAL;  Surgeon: Javier Docker, MD;  Location: [facility];  Service: Orthopedics;  Laterality: Bilateral;   No family history on file. Social History  Substance Use Topics  . Smoking status: Never Smoker   . Smokeless tobacco: Never Used  . Alcohol Use: Yes   OB History    No data available     Review of Systems  Constitutional: Negative for fever.  Genitourinary: Negative for difficulty urinating.  Musculoskeletal: Positive for back pain.  Skin: Positive for wound.  Neurological: Negative for weakness and numbness.  All other systems reviewed and are negative.     Allergies  Review of patient's allergies indicates no known  allergies.  Home Medications   Prior to Admission medications   Medication Sig Start Date End Date Taking? Authorizing Provider  docusate sodium (COLACE) 100 MG capsule Take 1 capsule (100 mg total) by mouth 2 (two) times daily as needed for mild constipation. 10/25/13   Jene Every, MD  HYDROcodone-acetaminophen (NORCO/VICODIN) 5-325 MG tablet Take 1 tablet by mouth every 6 (six) hours as needed for moderate pain. 01/16/15   Shon Baton, MD  ibuprofen (ADVIL,MOTRIN) 200 MG tablet Take 400 mg by mouth once as needed for headache or mild pain.    Historical Provider, MD  methocarbamol (ROBAXIN) 500 MG tablet Take 1 tablet (500 mg total) by mouth 3 (three) times daily between meals as needed for muscle spasms. 10/25/13   Jene Every, MD  methylPREDNISolone (MEDROL DOSEPAK) 4 MG TBPK tablet Take as instructed on packet 01/16/15   Shon Baton, MD  oxyCODONE-acetaminophen (PERCOCET) 7.5-325 MG per tablet Take 1-2 tablets by mouth every 4 (four) hours as needed for pain. 10/25/13   Jene Every, MD   BP 119/78 mmHg  Pulse 80  Temp(Src) 98.2 F (36.8 C) (Oral)  Resp 20  Ht 5\' 6"  (1.676 m)  Wt 270 lb (122.471 kg)  BMI 43.60 kg/m2  SpO2 99%  LMP 12/13/2014 Physical Exam  Constitutional: She is oriented to person, place, and time. She appears well-developed and well-nourished. No distress.  overweight  HENT:  Head: Normocephalic and atraumatic.  Cardiovascular: Normal rate, regular rhythm and normal  heart sounds.   No murmur heard. Pulmonary/Chest: Effort normal and breath sounds normal. No respiratory distress. She has no wheezes.  Musculoskeletal:  TTP lower lumbar, midline, no stepoff or deformities, +right leg raise Right great toe nail partially avulsed from the nailbed, no active bleeding, no pain  Neurological: She is alert and oriented to person, place, and time.  5/5 strength BLE, no clonus, normal reflexes  Skin: Skin is warm and dry.  Psychiatric: She has a normal  mood and affect.  Nursing note and vitals reviewed.   ED Course  Procedures (including critical care time) Labs Review Labs Reviewed - No data to display  Imaging Review No results found. I have personally reviewed and evaluated these images and lab results as part of my medical decision-making.   EKG Interpretation None      MDM   Final diagnoses:  Sciatica of right side    Patient presents with back pain.  Consistent with sciatica.  No signs or symptoms of cauda equina.  Patient given patient medication and toradol.  No imaging.  Discussed with patient a steroid dose pack and pain medication and close f/u with surgeon.  Regarding right great toe, I suspect the nail evulsion is chronic as it is non-tender and not bleeding.  Patient's nails are quite long and it may be a result of wearing shoes.  Discussed with patient trimming the nail and keeping it in place to protect the nail bed.  WIthout obvious injury, no indication for xrays.  After history, exam, and medical workup I feel the patient has been appropriately medically screened and is safe for discharge home. Pertinent diagnoses were discussed with the patient. Patient was given return precautions.     Shon Batonourtney F Maddisyn Hegwood, MD 01/17/15 [phone]

## 2015-01-16 NOTE — ED Notes (Signed)
C/o chronic back pain had last short for pain in back in July  Has been taking ibu but with weather changing pain is worse,  Al hit rt great toe and nail is loose

## 2016-01-02 ENCOUNTER — Emergency Department (HOSPITAL_BASED_OUTPATIENT_CLINIC_OR_DEPARTMENT_OTHER)
Admission: EM | Admit: 2016-01-02 | Discharge: 2016-01-02 | Disposition: A | Discharge status: Home / Self Care | Payer: Self-pay | Attending: Physician Assistant | Admitting: Physician Assistant

## 2016-01-02 ENCOUNTER — Encounter (HOSPITAL_BASED_OUTPATIENT_CLINIC_OR_DEPARTMENT_OTHER): Payer: Self-pay

## 2016-01-02 DIAGNOSIS — B9789 Other viral agents as the cause of diseases classified elsewhere: Secondary | ICD-10-CM

## 2016-01-02 DIAGNOSIS — Z7984 Long term (current) use of oral hypoglycemic drugs: Secondary | ICD-10-CM | POA: Insufficient documentation

## 2016-01-02 DIAGNOSIS — Z79899 Other long term (current) drug therapy: Secondary | ICD-10-CM | POA: Insufficient documentation

## 2016-01-02 DIAGNOSIS — E119 Type 2 diabetes mellitus without complications: Secondary | ICD-10-CM | POA: Insufficient documentation

## 2016-01-02 DIAGNOSIS — J069 Acute upper respiratory infection, unspecified: Secondary | ICD-10-CM | POA: Insufficient documentation

## 2016-01-02 DIAGNOSIS — I10 Essential (primary) hypertension: Secondary | ICD-10-CM | POA: Insufficient documentation

## 2016-01-02 HISTORY — DX: Type 2 diabetes mellitus without complications: E11.9

## 2016-01-02 HISTORY — DX: Essential (primary) hypertension: I10

## 2016-01-02 HISTORY — DX: Anemia, unspecified: D64.9

## 2016-01-02 MED ORDER — IPRATROPIUM-ALBUTEROL 0.5-2.5 (3) MG/3ML IN SOLN
3.0000 mL | Freq: Four times a day (QID) | RESPIRATORY_TRACT | Status: DC
  Administered 2016-01-02: 3 mL via RESPIRATORY_TRACT
  Filled 2016-01-02: qty 3

## 2016-01-02 NOTE — ED Provider Notes (Signed)
[facility] Provider Note   CSN: [id number] Arrival date & time: 01/02/16  1635 By signing my name below, I, Bridgette HabermannMaria Tan, attest that this documentation has been prepared under the direction and in the presence of Newell RubbermaidJeffrey Takyla Kuchera, PA-C. Electronically Signed: Bridgette HabermannMaria Tan, ED Scribe. 01/02/16. 5:29 PM.  History   Chief Complaint Chief Complaint  Patient presents with  . Cough   HPI Comments: [name] [name] is a 45 y.o. female with h/o DM and HTN who presents to the Emergency Department complaining of productive cough with green sputum onset two days ago with associated wheezing. Pt states she has chest pain, sore throat, headache, and back pain secondary to her cough. Pt has taken Tessalon with minimal relief. No known sick contacts with similar symptoms. Denies h/o similar symptoms. Denies h/o asthma. Pt is not a smoker. Pt further denies chills, rhinorrhea, congestion, or any other associated symptoms.  The history is provided by the patient. No language interpreter was used.    Past Medical History:  Diagnosis Date  . Anemia   . Diabetes mellitus without complication (HCC)   . Hypertension   . Pneumonia    pt to ER 07/14/2013 for community acquired pneumonia pt denies having pneumonia     Patient Active Problem List   Diagnosis Date Noted  . HNP (herniated nucleus pulposus), lumbar 10/25/2013    Past Surgical History:  Procedure Laterality Date  . LUMBAR LAMINECTOMY/DECOMPRESSION MICRODISCECTOMY Bilateral 10/25/2013   Procedure: MICRO LUMBAR DECOMPRESSION L5-S1 BILATERAL;  Surgeon: Javier DockerJeffrey C Beane, MD;  Location: [facility];  Service: Orthopedics;  Laterality: Bilateral;  . TUBAL LIGATION      OB History    No data available       Home Medications    Prior to Admission medications   Medication Sig Start Date End Date Taking? Authorizing Provider  Benzonatate (TESSALON PO) Take by mouth.   Yes Historical Provider, MD  HYDRALAZINE-HCTZ PO Take by mouth.   Yes  Historical Provider, MD  IRON PO Take by mouth.   Yes Historical Provider, MD  METFORMIN HCL PO Take by mouth.   Yes Historical Provider, MD    Family History No family history on file.  Social History Social History  Substance Use Topics  . Smoking status: Never Smoker  . Smokeless tobacco: Never Used  . Alcohol use No     Allergies   Patient has no known allergies.   Review of Systems Review of Systems 10 Systems reviewed and all are negative for acute change except as noted in the HPI. Physical Exam Updated Vital Signs BP 132/92 (BP Location: Right Arm)   Pulse 102   Temp 99.1 F (37.3 C) (Oral)   Resp 20   Ht 5\' 4"  (1.626 m)   Wt 132.5 kg   LMP 11/28/2015   SpO2 99%   BMI 50.16 kg/m   Physical Exam  Constitutional: She appears well-developed and well-nourished.  HENT:  Head: Normocephalic.  Eyes: Conjunctivae are normal.  Cardiovascular: Regular rhythm and normal heart sounds.  Exam reveals no gallop and no friction rub.   No murmur heard. Pulmonary/Chest: Effort normal and breath sounds normal. No respiratory distress. She has no wheezes. She has no rales.  Abdominal: She exhibits no distension.  Musculoskeletal: Normal range of motion.  Neurological: She is alert.  Skin: Skin is warm and dry.  Psychiatric: She has a normal mood and affect. Her behavior is normal.  Nursing note and vitals reviewed.  ED Treatments / Results  DIAGNOSTIC STUDIES: Oxygen Saturation is 99% on RA, normal by my interpretation.    COORDINATION OF CARE: 5:27 PM Discussed treatment plan with pt at bedside which includes breathing treatment and pt agreed to plan.  Labs (all labs ordered are listed, but only abnormal results are displayed) Labs Reviewed - No data to display  EKG  EKG Interpretation None       Radiology No results found.  Procedures Procedures (including critical care time)  Medications Ordered in ED Medications  ipratropium-albuterol (DUONEB)  0.5-2.5 (3) MG/3ML nebulizer solution 3 mL (3 mLs Nebulization Given 01/02/16 1735)     Initial Impression / Assessment and Plan / ED Course  I have reviewed the triage vital signs and the nursing notes.  Pertinent labs & imaging results that were available during my care of the patient were reviewed by me and considered in my medical decision making (see chart for details).  Clinical Course      Final Clinical Impressions(s) / ED Diagnoses   Final diagnoses:  Viral URI with cough   Labs:  Imaging:  Consults:  Therapeutics:  Discharge Meds:   Assessment/Plan:  45 year old female presents today with likely viral URI. She is afebrile and nontoxic, she has clear lung sounds. Patient reports that she feels like she is wheezing, she will be given a breathing treatment here for symptomatic improvement. Patient reports that breathing treatment did not significantly improve her symptoms, again listening to her chest she has perfectly clear lung sounds. Patient appears to be in no acute distress, she'll be discharged home with symptomatic care instructions primary care follow-up and strict return precautions. She verbalized understanding and agreement to today's plan and no further questions or concerns   New Prescriptions New Prescriptions   No medications on file   I personally performed the services described in this documentation, which was scribed in my presence. The recorded information has been reviewed and is accurate.    Eyvonne MechanicJeffrey Kelani Robart, PA-C 01/02/16 1855    Courteney Randall AnLyn Mackuen, MD 01/02/16 2221

## 2016-01-02 NOTE — ED Triage Notes (Signed)
Cough x 2 days-NAD-steady gait 

## 2016-01-02 NOTE — Discharge Instructions (Signed)
Please read attached information. If you experience any new or worsening signs or symptoms please return to the emergency room for evaluation. Please follow-up with your primary care provider or specialist as discussed.  °

## 2016-01-05 ENCOUNTER — Emergency Department (HOSPITAL_BASED_OUTPATIENT_CLINIC_OR_DEPARTMENT_OTHER): Payer: Self-pay

## 2016-01-05 ENCOUNTER — Encounter (HOSPITAL_BASED_OUTPATIENT_CLINIC_OR_DEPARTMENT_OTHER): Payer: Self-pay | Admitting: Emergency Medicine

## 2016-01-05 ENCOUNTER — Emergency Department (HOSPITAL_BASED_OUTPATIENT_CLINIC_OR_DEPARTMENT_OTHER)
Admission: EM | Admit: 2016-01-05 | Discharge: 2016-01-05 | Disposition: A | Discharge status: Home / Self Care | Payer: Self-pay | Attending: Emergency Medicine | Admitting: Emergency Medicine

## 2016-01-05 DIAGNOSIS — I10 Essential (primary) hypertension: Secondary | ICD-10-CM | POA: Insufficient documentation

## 2016-01-05 DIAGNOSIS — J069 Acute upper respiratory infection, unspecified: Secondary | ICD-10-CM | POA: Insufficient documentation

## 2016-01-05 DIAGNOSIS — E119 Type 2 diabetes mellitus without complications: Secondary | ICD-10-CM | POA: Insufficient documentation

## 2016-01-05 LAB — BASIC METABOLIC PANEL
Anion gap: 9 (ref 5–15)
BUN: 13 mg/dL (ref 6–20)
CO2: 27 mmol/L (ref 22–32)
Calcium: 8.6 mg/dL — ABNORMAL LOW (ref 8.9–10.3)
Chloride: 98 mmol/L — ABNORMAL LOW (ref 101–111)
Creatinine, Ser: 1.17 mg/dL — ABNORMAL HIGH (ref 0.44–1.00)
GFR calc Af Amer: >60 mL/min (ref >60)
GFR calc non Af Amer: 55 mL/min — ABNORMAL LOW (ref >60)
Glucose, Bld: 119 mg/dL — ABNORMAL HIGH (ref 65–99)
Potassium: 3.3 mmol/L — ABNORMAL LOW (ref 3.5–5.1)
Sodium: 134 mmol/L — ABNORMAL LOW (ref 135–145)

## 2016-01-05 LAB — CBC WITH DIFFERENTIAL/PLATELET
Basophils Absolute: 0 10*3/uL (ref 0.0–0.1)
Basophils Relative: 0 %
Eosinophils Absolute: 0 10*3/uL (ref 0.0–0.7)
Eosinophils Relative: 1 %
HCT: 41.5 % (ref 36.0–46.0)
Hemoglobin: 13.8 g/dL (ref 12.0–15.0)
Lymphocytes Relative: 24 %
Lymphs Abs: 1.3 10*3/uL (ref 0.7–4.0)
MCH: 26.9 pg (ref 26.0–34.0)
MCHC: 33.3 g/dL (ref 30.0–36.0)
MCV: 80.9 fL (ref 78.0–100.0)
Monocytes Absolute: 0.5 10*3/uL (ref 0.1–1.0)
Monocytes Relative: 9 %
Neutro Abs: 3.6 10*3/uL (ref 1.7–7.7)
Neutrophils Relative %: 66 %
Platelets: 281 10*3/uL (ref 150–400)
RBC: 5.13 MIL/uL — ABNORMAL HIGH (ref 3.87–5.11)
RDW: 18.1 % — ABNORMAL HIGH (ref 11.5–15.5)
WBC: 5.5 10*3/uL (ref 4.0–10.5)

## 2016-01-05 MED ORDER — PROMETHAZINE-DM 6.25-15 MG/5ML PO SYRP: 5.0000 mL | ORAL_SOLUTION | Freq: Four times a day (QID) | ORAL | 0 refills | Status: DC | PRN

## 2016-01-05 MED ORDER — HYDROCOD POLST-CPM POLST ER 10-8 MG/5ML PO SUER
5.0000 mL | Freq: Once | ORAL | Status: AC
  Administered 2016-01-05: 5 mL via ORAL
  Filled 2016-01-05: qty 5

## 2016-01-05 MED ORDER — IBUPROFEN 400 MG PO TABS
600.0000 mg | ORAL_TABLET | Freq: Once | ORAL | Status: AC
  Administered 2016-01-05: 600 mg via ORAL
  Filled 2016-01-05: qty 1

## 2016-01-05 MED ORDER — IPRATROPIUM-ALBUTEROL 0.5-2.5 (3) MG/3ML IN SOLN
3.0000 mL | Freq: Once | RESPIRATORY_TRACT | Status: AC
  Administered 2016-01-05: 3 mL via RESPIRATORY_TRACT
  Filled 2016-01-05: qty 3

## 2016-01-05 MED ORDER — ALBUTEROL SULFATE (2.5 MG/3ML) 0.083% IN NEBU
2.5000 mg | INHALATION_SOLUTION | Freq: Once | RESPIRATORY_TRACT | Status: AC
  Administered 2016-01-05: 2.5 mg via RESPIRATORY_TRACT
  Filled 2016-01-05: qty 3

## 2016-01-05 MED ORDER — AEROCHAMBER PLUS FLO-VU MEDIUM MISC
1.0000 | Freq: Once | Status: AC
  Administered 2016-01-05: 1
  Filled 2016-01-05: qty 1

## 2016-01-05 MED ORDER — ACETAMINOPHEN 325 MG PO TABS
650.0000 mg | ORAL_TABLET | Freq: Once | ORAL | Status: AC
  Administered 2016-01-05: 650 mg via ORAL
  Filled 2016-01-05: qty 2

## 2016-01-05 MED ORDER — ALBUTEROL SULFATE HFA 108 (90 BASE) MCG/ACT IN AERS
2.0000 | INHALATION_SPRAY | RESPIRATORY_TRACT | Status: DC | PRN
  Administered 2016-01-05: 2 via RESPIRATORY_TRACT
  Filled 2016-01-05: qty 6.7

## 2016-01-05 MED ORDER — IPRATROPIUM BROMIDE 0.02 % IN SOLN
0.5000 mg | RESPIRATORY_TRACT | Status: DC
  Filled 2016-01-05: qty 2.5

## 2016-01-05 MED ORDER — ALBUTEROL SULFATE (2.5 MG/3ML) 0.083% IN NEBU
5.0000 mg | INHALATION_SOLUTION | Freq: Once | RESPIRATORY_TRACT | Status: AC
  Administered 2016-01-05: 5 mg via RESPIRATORY_TRACT
  Filled 2016-01-05: qty 6

## 2016-01-05 MED ORDER — GUAIFENESIN ER 1200 MG PO TB12: 1.0000 | ORAL_TABLET | Freq: Two times a day (BID) | ORAL | 0 refills | Status: DC

## 2016-01-05 MED ORDER — AZITHROMYCIN 250 MG PO TABS: ORAL_TABLET | ORAL | 0 refills | Status: DC

## 2016-01-05 NOTE — Discharge Instructions (Signed)
Return here as needed.  Follow-up with your primary care Dr. for recheck, increase your fluid intake and rest as much as possible °

## 2016-01-05 NOTE — ED Provider Notes (Signed)
[facility]   CSN: [id number] Arrival date & time: 01/05/16  1214     History   Chief Complaint Chief Complaint  Patient presents with  . Cough    HPI [name] [name] is a 45 y.o. female.  HPI Patient presents to the emergency department with cough, nasal congestion, fevers over the last 4 days.  The patient states she was seen here 2 days ago and was sent home with no medications.  The patient, states she has got worsens that time.  Patient states that nothing seems make the condition better or worse.  She states that her cough is worse at night.  Patient denies taking any over-the-counter medications other than Tylenol. The patient denies chest pain, shortness of breath, headache,blurred vision, neck pain, weakness, numbness, dizziness, anorexia, edema, abdominal pain, nausea, vomiting, diarrhea, rash, back pain, dysuria, hematemesis, bloody stool, near syncope, or syncope. Past Medical History:  Diagnosis Date  . Anemia   . Diabetes mellitus without complication (HCC)   . Hypertension   . Pneumonia    pt to ER 07/14/2013 for community acquired pneumonia pt denies having pneumonia     Patient Active Problem List   Diagnosis Date Noted  . HNP (herniated nucleus pulposus), lumbar 10/25/2013    Past Surgical History:  Procedure Laterality Date  . LUMBAR LAMINECTOMY/DECOMPRESSION MICRODISCECTOMY Bilateral 10/25/2013   Procedure: MICRO LUMBAR DECOMPRESSION L5-S1 BILATERAL;  Surgeon: Javier DockerJeffrey C Beane, MD;  Location: [facility];  Service: Orthopedics;  Laterality: Bilateral;  . TUBAL LIGATION      OB History    No data available       Home Medications    Prior to Admission medications   Medication Sig Start Date End Date Taking? Authorizing Provider  Benzonatate (TESSALON PO) Take by mouth.    Historical Provider, MD  HYDRALAZINE-HCTZ PO Take by mouth.    Historical Provider, MD  IRON PO Take by mouth.    Historical Provider, MD  METFORMIN HCL PO  Take by mouth.    Historical Provider, MD    Family History History reviewed. No pertinent family history.  Social History Social History  Substance Use Topics  . Smoking status: Never Smoker  . Smokeless tobacco: Never Used  . Alcohol use No     Allergies   Patient has no known allergies.   Review of Systems Review of Systems All other systems negative except as documented in the HPI. All pertinent positives and negatives as reviewed in the HPI.  Physical Exam Updated Vital Signs BP 122/76 (BP Location: Right Arm)   Pulse 113   Temp 101.5 F (38.6 C) (Oral)   Resp 22   Ht 5\' 4"  (1.626 m)   Wt 133.4 kg   LMP 11/28/2015   SpO2 94%   BMI 50.46 kg/m   Physical Exam  Constitutional: She is oriented to person, place, and time. She appears well-developed and well-nourished. No distress.  HENT:  Head: Normocephalic and atraumatic.  Mouth/Throat: Oropharynx is clear and moist.  Eyes: Pupils are equal, round, and reactive to light.  Neck: Normal range of motion. Neck supple.  Cardiovascular: Normal rate, regular rhythm and normal heart sounds.  Exam reveals no gallop and no friction rub.   No murmur heard. Pulmonary/Chest: Effort normal. No respiratory distress. She has wheezes. She has no rales. She exhibits no tenderness.  Abdominal: Soft. Bowel sounds are normal. She exhibits no distension. There is no tenderness.  Neurological: She is alert and oriented to person,  place, and time. She exhibits normal muscle tone. Coordination normal.  Skin: Skin is warm and dry. No rash noted. No erythema.  Psychiatric: She has a normal mood and affect. Her behavior is normal.  Nursing note and vitals reviewed.    ED Treatments / Results  Labs (all labs ordered are listed, but only abnormal results are displayed) Labs Reviewed  CBC WITH DIFFERENTIAL/PLATELET - Abnormal; Notable for the following:       Result Value   RBC 5.13 (*)    RDW 18.1 (*)    All other components within  normal limits  BASIC METABOLIC PANEL - Abnormal; Notable for the following:    Sodium 134 (*)    Potassium 3.3 (*)    Chloride 98 (*)    Glucose, Bld 119 (*)    Creatinine, Ser 1.17 (*)    Calcium 8.6 (*)    GFR calc non Af Amer 55 (*)    All other components within normal limits    EKG  EKG Interpretation None       Radiology Dg Chest 2 View  Result Date: 01/05/2016 CLINICAL DATA:  Cough and fever for 4 days. Hypertension and diabetes. EXAM: CHEST  2 VIEW COMPARISON:  10/17/2013, 03/04/2014 FINDINGS: Low lung volumes similar to the prior study accentuating the hilar vascularity. No focal pneumonia, collapse or consolidation. Negative for edema, effusion or pneumothorax. Left mid lung 13 mm nodular opacity versus nodule noted. Trachea is midline.  No acute osseous finding. IMPRESSION: Low volume chest exam as before without acute process. Left midlung 13 mm nodule. Recommend follow-up nonemergent chest CT. These results will be called to the ordering clinician or representative by the Radiologist Assistant, and communication documented in the PACS or zVision Dashboard. Electronically Signed   By: Judie PetitM.  Shick M.D.   On: 01/05/2016 13:54    Procedures Procedures (including critical care time)  Medications Ordered in ED Medications  ipratropium (ATROVENT) nebulizer solution 0.5 mg (not administered)  chlorpheniramine-HYDROcodone (TUSSIONEX) 10-8 MG/5ML suspension 5 mL (5 mLs Oral Given 01/05/16 1353)  albuterol (PROVENTIL) (2.5 MG/3ML) 0.083% nebulizer solution 5 mg (5 mg Nebulization Given 01/05/16 1353)  acetaminophen (TYLENOL) tablet 650 mg (650 mg Oral Given 01/05/16 1424)  ibuprofen (ADVIL,MOTRIN) tablet 600 mg (600 mg Oral Given 01/05/16 1429)     Initial Impression / Assessment and Plan / ED Course  I have reviewed the triage vital signs and the nursing notes.  Pertinent labs & imaging results that were available during my care of the patient were reviewed by me and  considered in my medical decision making (see chart for details).  Clinical Course     Patient be treated for an early pneumonia based on the fact that she has decreased breath sounds on the right and the fever.  Patient is advised plan and all questions were answered.  Patient is feeling better following for similar treatment.  We will also give her a second treatment  Final Clinical Impressions(s) / ED Diagnoses   Final diagnoses:  None    New Prescriptions New Prescriptions   No medications on file     Charlestine NightChristopher Carver Murakami, PA-C 01/05/16 1532    Arby BarretteMarcy Pfeiffer, MD 01/08/16 [phone]

## 2016-01-05 NOTE — ED Triage Notes (Signed)
Patient was here on wed  "and it aint gotten no better"  - patient reports that she continues to have cough and Headache

## 2016-09-27 ENCOUNTER — Emergency Department (HOSPITAL_BASED_OUTPATIENT_CLINIC_OR_DEPARTMENT_OTHER)
Admission: EM | Admit: 2016-09-27 | Discharge: 2016-09-27 | Disposition: A | Discharge status: Home / Self Care | Payer: No Typology Code available for payment source | Attending: Emergency Medicine | Admitting: Emergency Medicine

## 2016-09-27 ENCOUNTER — Encounter (HOSPITAL_BASED_OUTPATIENT_CLINIC_OR_DEPARTMENT_OTHER): Payer: Self-pay | Admitting: Emergency Medicine

## 2016-09-27 ENCOUNTER — Emergency Department (HOSPITAL_BASED_OUTPATIENT_CLINIC_OR_DEPARTMENT_OTHER): Payer: No Typology Code available for payment source

## 2016-09-27 DIAGNOSIS — E119 Type 2 diabetes mellitus without complications: Secondary | ICD-10-CM | POA: Insufficient documentation

## 2016-09-27 DIAGNOSIS — I10 Essential (primary) hypertension: Secondary | ICD-10-CM | POA: Diagnosis not present

## 2016-09-27 DIAGNOSIS — Z7984 Long term (current) use of oral hypoglycemic drugs: Secondary | ICD-10-CM | POA: Insufficient documentation

## 2016-09-27 DIAGNOSIS — M549 Dorsalgia, unspecified: Secondary | ICD-10-CM | POA: Insufficient documentation

## 2016-09-27 LAB — PREGNANCY, URINE: Preg Test, Ur: NEGATIVE

## 2016-09-27 MED ORDER — IBUPROFEN 400 MG PO TABS
600.0000 mg | ORAL_TABLET | Freq: Once | ORAL | Status: AC
  Administered 2016-09-27: 600 mg via ORAL
  Filled 2016-09-27: qty 1

## 2016-09-27 MED ORDER — IBUPROFEN 600 MG PO TABS: 600.0000 mg | ORAL_TABLET | Freq: Three times a day (TID) | ORAL | 0 refills | Status: AC

## 2016-09-27 MED ORDER — IBUPROFEN 400 MG PO TABS
600.0000 mg | ORAL_TABLET | Freq: Three times a day (TID) | ORAL | Status: DC
  Administered 2016-09-27: 600 mg via ORAL
  Filled 2016-09-27: qty 1
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
## 2016-09-27 NOTE — Discharge Instructions (Signed)
Please read attached information. If you experience any new or worsening signs or symptoms please return to the emergency room for evaluation. Please follow-up with your primary care provider or specialist as discussed. Please use medication prescribed only as directed and discontinue taking if you have any concerning signs or symptoms.   °
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
## 2018-12-28 ENCOUNTER — Emergency Department (HOSPITAL_BASED_OUTPATIENT_CLINIC_OR_DEPARTMENT_OTHER)
Admission: EM | Admit: 2018-12-28 | Discharge: 2018-12-28 | Disposition: A | Discharge status: Home / Self Care | Payer: Medicaid Other | Attending: Emergency Medicine | Admitting: Emergency Medicine

## 2018-12-28 ENCOUNTER — Encounter (HOSPITAL_BASED_OUTPATIENT_CLINIC_OR_DEPARTMENT_OTHER): Payer: Self-pay | Admitting: *Deleted

## 2018-12-28 ENCOUNTER — Emergency Department (HOSPITAL_BASED_OUTPATIENT_CLINIC_OR_DEPARTMENT_OTHER): Payer: Medicaid Other

## 2018-12-28 ENCOUNTER — Other Ambulatory Visit: Payer: Self-pay

## 2018-12-28 DIAGNOSIS — Z79899 Other long term (current) drug therapy: Secondary | ICD-10-CM | POA: Diagnosis not present

## 2018-12-28 DIAGNOSIS — E119 Type 2 diabetes mellitus without complications: Secondary | ICD-10-CM | POA: Diagnosis not present

## 2018-12-28 DIAGNOSIS — I10 Essential (primary) hypertension: Secondary | ICD-10-CM | POA: Diagnosis not present

## 2018-12-28 DIAGNOSIS — R05 Cough: Secondary | ICD-10-CM | POA: Diagnosis present

## 2018-12-28 DIAGNOSIS — U071 COVID-19: Secondary | ICD-10-CM | POA: Diagnosis not present

## 2018-12-28 LAB — CBC WITH DIFFERENTIAL/PLATELET
Abs Immature Granulocytes: 0.02 10*3/uL (ref 0.00–0.07)
Basophils Absolute: 0 10*3/uL (ref 0.0–0.1)
Basophils Relative: 0 %
Eosinophils Absolute: 0 10*3/uL (ref 0.0–0.5)
Eosinophils Relative: 0 %
HCT: 39.4 % (ref 36.0–46.0)
Hemoglobin: 13.1 g/dL (ref 12.0–15.0)
Immature Granulocytes: 1 %
Lymphocytes Relative: 14 %
Lymphs Abs: 0.6 10*3/uL — ABNORMAL LOW (ref 0.7–4.0)
MCH: 28.5 pg (ref 26.0–34.0)
MCHC: 33.2 g/dL (ref 30.0–36.0)
MCV: 85.7 fL (ref 80.0–100.0)
Monocytes Absolute: 0.2 10*3/uL (ref 0.1–1.0)
Monocytes Relative: 5 %
Neutro Abs: 3.1 10*3/uL (ref 1.7–7.7)
Neutrophils Relative %: 80 %
Platelets: 175 10*3/uL (ref 150–400)
RBC: 4.6 MIL/uL (ref 3.87–5.11)
RDW: 15.3 % (ref 11.5–15.5)
WBC: 3.9 10*3/uL — ABNORMAL LOW (ref 4.0–10.5)
nRBC: 0 % (ref 0.0–0.2)

## 2018-12-28 LAB — COMPREHENSIVE METABOLIC PANEL
ALT: 31 U/L (ref 0–44)
AST: 40 U/L (ref 15–41)
Albumin: 3.3 g/dL — ABNORMAL LOW (ref 3.5–5.0)
Alkaline Phosphatase: 61 U/L (ref 38–126)
Anion gap: 7 (ref 5–15)
BUN: 9 mg/dL (ref 6–20)
CO2: 24 mmol/L (ref 22–32)
Calcium: 8.2 mg/dL — ABNORMAL LOW (ref 8.9–10.3)
Chloride: 102 mmol/L (ref 98–111)
Creatinine, Ser: 1.11 mg/dL — ABNORMAL HIGH (ref 0.44–1.00)
GFR calc Af Amer: >60 mL/min (ref >60)
GFR calc non Af Amer: 58 mL/min — ABNORMAL LOW (ref >60)
Glucose, Bld: 176 mg/dL — ABNORMAL HIGH (ref 70–99)
Potassium: 3.4 mmol/L — ABNORMAL LOW (ref 3.5–5.1)
Sodium: 133 mmol/L — ABNORMAL LOW (ref 135–145)
Total Bilirubin: 0.4 mg/dL (ref 0.3–1.2)
Total Protein: 7.2 g/dL (ref 6.5–8.1)

## 2018-12-28 LAB — SARS CORONAVIRUS 2 AG (30 MIN TAT): SARS Coronavirus 2 Ag: POSITIVE — AB

## 2018-12-28 LAB — LIPASE, BLOOD: Lipase: 26 U/L (ref 11–51)

## 2018-12-28 MED ORDER — IBUPROFEN 800 MG PO TABS
800.0000 mg | ORAL_TABLET | Freq: Once | ORAL | Status: AC
  Administered 2018-12-28: 18:00:00 800 mg via ORAL
  Filled 2018-12-28: qty 1

## 2018-12-28 MED ORDER — BENZONATATE 100 MG PO CAPS: 100.0000 mg | ORAL_CAPSULE | Freq: Three times a day (TID) | ORAL | 0 refills | Status: AC

## 2018-12-28 MED ORDER — ONDANSETRON HCL 4 MG PO TABS: 4.0000 mg | ORAL_TABLET | Freq: Four times a day (QID) | ORAL | 0 refills | Status: AC

## 2018-12-28 MED ORDER — ACETAMINOPHEN 500 MG PO TABS
1000.0000 mg | ORAL_TABLET | Freq: Once | ORAL | Status: AC
  Administered 2018-12-28: 20:00:00 1000 mg via ORAL
  Filled 2018-12-28: qty 2

## 2018-12-28 MED ORDER — SODIUM CHLORIDE 0.9 % IV BOLUS (SEPSIS)
1000.0000 mL | Freq: Once | INTRAVENOUS | Status: AC
Start: 2018-12-28 — End: 2018-12-28
  Administered 2018-12-28: 1000 mL via INTRAVENOUS

## 2018-12-28 NOTE — ED Notes (Signed)
Pt aware of need for urine specimen, unable to provide at this time. 

## 2018-12-28 NOTE — ED Provider Notes (Signed)
[facility] EMERGENCY DEPARTMENT Provider Note   CSN: [id number] Arrival date & time: 12/28/18  1740     History Chief Complaint  Patient presents with  . Cough    [name] [name] is a 48 y.o. female.  Patient is a 48 year old female with past medical history of diabetes, hypertension, anemia presenting to the emergency department for 4 days of URI symptoms.  Patient reports cough, fever, body aches, congestion, diarrhea and nausea.  Reports one episode of vomiting this morning.  No known sick contacts.  Has not tried anything for relief.  Denies any shortness of breath, chest pain, abdominal pain.        Past Medical History:  Diagnosis Date  . Anemia   . Diabetes mellitus without complication ([place])   . Hypertension   . Pneumonia    pt to ER 07/14/2013 for community acquired pneumonia pt denies having pneumonia     Patient Active Problem List   Diagnosis Date Noted  . HNP (herniated nucleus pulposus), lumbar 10/25/2013    Past Surgical History:  Procedure Laterality Date  . LUMBAR LAMINECTOMY/DECOMPRESSION MICRODISCECTOMY Bilateral 10/25/2013   Procedure: MICRO LUMBAR DECOMPRESSION L5-S1 BILATERAL;  Surgeon: Johnn Hai, MD;  Location: [facility];  Service: Orthopedics;  Laterality: Bilateral;  . TUBAL LIGATION       OB History   No obstetric history on file.     History reviewed. No pertinent family history.  Social History   Tobacco Use  . Smoking status: Never Smoker  . Smokeless tobacco: Never Used  Substance Use Topics  . Alcohol use: No  . Drug use: No    Home Medications Prior to Admission medications   Medication Sig Start Date End Date Taking? Authorizing Provider  Benzonatate (TESSALON PO) Take by mouth.    [provider]  escitalopram (LEXAPRO) 10 MG tablet Take 10 mg by mouth daily.    [provider]  HYDRALAZINE-HCTZ PO Take by mouth.    [provider]  hydrochlorothiazide (HYDRODIURIL) 25 MG tablet  Take 25 mg by mouth daily.    [provider]  ibuprofen (ADVIL,MOTRIN) 600 MG tablet Take 1 tablet (600 mg total) by mouth 3 (three) times daily. 09/27/16   Hedges, Dellis Filbert, PA-C  IRON PO Take by mouth.    [provider]  METFORMIN HCL PO Take by mouth.    [provider]    Allergies    Patient has no known allergies.  Review of Systems   Review of Systems  Constitutional: Positive for appetite change, chills, fatigue and fever. Negative for activity change, diaphoresis and unexpected weight change.  HENT: Positive for congestion and rhinorrhea. Negative for sinus pressure, sinus pain and sneezing.   Respiratory: Positive for cough. Negative for apnea, chest tightness, shortness of breath and wheezing.   Cardiovascular: Negative for chest pain.  Gastrointestinal: Positive for diarrhea, nausea and vomiting. Negative for abdominal distention, abdominal pain, constipation and rectal pain.  Genitourinary: Negative for decreased urine volume, dysuria, urgency, vaginal bleeding and vaginal discharge.  Musculoskeletal: Negative for back pain.  Skin: Negative for rash.  Neurological: Negative for dizziness, light-headedness and headaches.    Physical Exam Updated Vital Signs BP 114/87   Pulse (!) 120   Temp 100.2 F (37.9 C) (Oral)   Resp 18   Ht 5\' 6"  (1.676 m)   Wt 122.5 kg   SpO2 94%   BMI 43.58 kg/m   Physical Exam Vitals and nursing note reviewed.  Constitutional:  General: She is not in acute distress.    Appearance: Normal appearance. She is obese. She is not ill-appearing, toxic-appearing or diaphoretic.  HENT:     Head: Normocephalic.     Mouth/Throat:     Mouth: Mucous membranes are moist.  Eyes:     Conjunctiva/sclera: Conjunctivae normal.  Cardiovascular:     Rate and Rhythm: Regular rhythm. Tachycardia present.  Pulmonary:     Effort: Pulmonary effort is normal. No respiratory distress.     Breath sounds: Normal breath sounds. No  stridor. No wheezing, rhonchi or rales.  Chest:     Chest wall: No tenderness.  Abdominal:     General: Abdomen is flat.     Tenderness: There is no abdominal tenderness. There is no right CVA tenderness, left CVA tenderness or guarding.  Skin:    General: Skin is dry.  Neurological:     Mental Status: She is alert.  Psychiatric:        Mood and Affect: Mood normal.     ED Results / Procedures / Treatments   Labs (all labs ordered are listed, but only abnormal results are displayed) Labs Reviewed  SARS CORONAVIRUS 2 AG (30 MIN TAT)  CBC WITH DIFFERENTIAL/PLATELET  COMPREHENSIVE METABOLIC PANEL  LIPASE, BLOOD  URINALYSIS, ROUTINE W REFLEX MICROSCOPIC    EKG None  Radiology No results found.  Procedures Procedures (including critical care time)  Medications Ordered in ED Medications  sodium chloride 0.9 % bolus 1,000 mL (has no administration in time range)  ibuprofen (ADVIL) tablet 800 mg (has no administration in time range)    ED Course  I have reviewed the triage vital signs and the nursing notes.  Pertinent labs & imaging results that were available during my care of the patient were reviewed by me and considered in my medical decision making (see chart for details).  Clinical Course as of Dec 31 2315  Wed Dec 28, 2018  1807 Patient presenting with covid/viral sx for 4 days. Tachycardic and temp of 100.2 on presentation. Not hypoxic. Patient was ambulated and remained 94-94% on RA without complaints of SOB. Will obtain labs, chest xray and treat symptomatically with fluids and motrin.    [KM]  1905 Patient's Covid test is positive.  She has a mild hyponatremia and hypokalemia likely due to poor oral intake and mild diarrhea.  Her creatinine is at baseline.  She was treated with fluids and Motrin.  She is not hypoxic or tachypneic even with ambulation. Tachycardia improved with fluids. Explained to patient her diagnosis and treatment and the need to quarantine.  Explained to patient strict return precautions. Case discussed with Dr.Delo and plan agreed upon prior to discharge.   SARS Coronavirus 2 Ag(!): POSITIVE [KM]    Clinical Course User Index [KM] Jeral PinchMcLean, Zenia Guest A, PA-C   MDM Rules/Calculators/A&P                      Based on review of vitals, medical screening exam, lab work and/or imaging, there does not appear to be an acute, emergent etiology for the patient's symptoms. Counseled pt on good return precautions and encouraged both PCP and ED follow-up as needed.  Prior to discharge, I also discussed incidental imaging findings with patient in detail and advised appropriate, recommended follow-up in detail.  Clinical Impression: 1. COVID-19     Disposition: Discharge  Prior to providing a prescription for a controlled substance, I independently reviewed the patient's recent prescription history on the  [place] Controlled Substance Reporting System. The patient had no recent or regular prescriptions and was deemed appropriate for a brief, less than 3 day prescription of narcotic for acute analgesia.  This note was prepared with assistance of Conservation officer, historic buildings. Occasional wrong-word or sound-a-like substitutions may have occurred due to the inherent limitations of voice recognition software.  Final Clinical Impression(s) / ED Diagnoses Final diagnoses:  None    Rx / DC Orders ED Discharge Orders    None       Jeral Pinch 12/31/18 2318    Geoffery Lyons, MD 01/04/19 1505

## 2018-12-28 NOTE — Discharge Instructions (Addendum)
You have covid 19. Your chest xray was clear without any signs of pneumonia. Your labs were reassuring and your oxygen and other vitals were normal. Covid is a virus that can cause several different symptoms and make you feel ill for 10 days or more. Most people recover from covid 19 with no issues. It is reassuring that your workup in the ER looked very good. There is no cure for covid 19. You body will fight it on its own. It is important you stay hydrated with lots of water. I have sent some medication to the pharmacy for you that may help with some of your symptoms. Take tylenol for fever and body aches. If you are unable to stay hydrated or if you feel significantly short of breath you may be re-evaluated in the ER but otherwise you should stay at home and away from other people until your symptoms have been going on for at least 10 days and you have not had a fever for 72 hours or more without fever reducing medications. Thank you for allowing me to care for you today. Please return to the emergency department if you have new or worsening symptoms. Take your medications as instructed.

## 2018-12-28 NOTE — ED Triage Notes (Signed)
Co cough, n/v/d , bodyaches , h/a x 4 days

## 2018-12-29 ENCOUNTER — Other Ambulatory Visit: Payer: Self-pay | Admitting: Nurse Practitioner

## 2018-12-29 DIAGNOSIS — U071 COVID-19: Secondary | ICD-10-CM

## 2018-12-29 DIAGNOSIS — Z6841 Body Mass Index (BMI) 40.0 and over, adult: Secondary | ICD-10-CM

## 2018-12-29 NOTE — Progress Notes (Signed)
  I connected by phone with [name] [name] on 12/29/2018 at 1:12 PM to discuss the potential use of an new treatment for mild to moderate COVID-19 viral infection in non-hospitalized patients.  This patient is a 49 y.o. female that meets the FDA criteria for Emergency Use Authorization of bamlanivimab or casirivimab\imdevimab.  Has a (+) direct SARS-CoV-2 viral test result  Has mild or moderate COVID-19   Is ? 50 years of age and weighs ? 40 kg  Is NOT hospitalized due to COVID-19  Is NOT requiring oxygen therapy or requiring an increase in baseline oxygen flow rate due to COVID-19  Is within 10 days of symptom onset  Has at least one of the high risk factor(s) for progression to severe COVID-19 and/or hospitalization as defined in EUA.  Specific high risk criteria : BMI >/= 35  Patient's BMI is 43.58  I have spoken and communicated the following to the patient or parent/caregiver:  1. FDA has authorized the emergency use of bamlanivimab and casirivimab\imdevimab for the treatment of mild to moderate COVID-19 in adults and pediatric patients with positive results of direct SARS-CoV-2 viral testing who are 47 years of age and older weighing at least 40 kg, and who are at high risk for progressing to severe COVID-19 and/or hospitalization.  2. The significant known and potential risks and benefits of bamlanivimab and casirivimab\imdevimab, and the extent to which such potential risks and benefits are unknown.  3. Information on available alternative treatments and the risks and benefits of those alternatives, including clinical trials.  4. Patients treated with bamlanivimab and casirivimab\imdevimab should continue to self-isolate and use infection control measures (e.g., wear mask, isolate, social distance, avoid sharing personal items, clean and disinfect "high touch" surfaces, and frequent handwashing) according to CDC guidelines.   5. The patient or parent/caregiver has the option  to accept or refuse bamlanivimab or casirivimab\imdevimab .  After reviewing this information with the patient, The patient agreed to proceed with receiving the bamlanimivab infusion and will be provided a copy of the Fact sheet prior to receiving the infusion.Fenton Foy 12/29/2018 1:12 PM

## 2018-12-30 ENCOUNTER — Ambulatory Visit (HOSPITAL_COMMUNITY)
Admission: RE | Admit: 2018-12-30 | Discharge: 2018-12-30 | Disposition: A | Discharge status: Home / Self Care | Payer: HRSA Program | Source: Ambulatory Visit | Attending: Pulmonary Disease | Admitting: Pulmonary Disease

## 2018-12-30 DIAGNOSIS — Z6841 Body Mass Index (BMI) 40.0 and over, adult: Secondary | ICD-10-CM | POA: Diagnosis present

## 2018-12-30 DIAGNOSIS — Z23 Encounter for immunization: Secondary | ICD-10-CM | POA: Insufficient documentation

## 2018-12-30 DIAGNOSIS — U071 COVID-19: Secondary | ICD-10-CM | POA: Diagnosis present

## 2018-12-30 MED ORDER — METHYLPREDNISOLONE SODIUM SUCC 125 MG IJ SOLR: 125.0000 mg | Freq: Once | INTRAMUSCULAR | Status: DC | PRN

## 2018-12-30 MED ORDER — SODIUM CHLORIDE 0.9 % IV SOLN
700.0000 mg | Freq: Once | INTRAVENOUS | Status: AC
  Administered 2018-12-30: 700 mg via INTRAVENOUS
  Filled 2018-12-30: qty 20

## 2018-12-30 MED ORDER — SODIUM CHLORIDE 0.9 % IV SOLN
INTRAVENOUS | Status: DC | PRN
  Administered 2018-12-30: 250 mL via INTRAVENOUS

## 2018-12-30 MED ORDER — ACETAMINOPHEN 325 MG PO TABS
650.0000 mg | ORAL_TABLET | Freq: Once | ORAL | Status: AC
  Administered 2018-12-30: 16:00:00 650 mg via ORAL

## 2018-12-30 MED ORDER — ALBUTEROL SULFATE HFA 108 (90 BASE) MCG/ACT IN AERS: 2.0000 | INHALATION_SPRAY | Freq: Once | RESPIRATORY_TRACT | Status: DC | PRN

## 2018-12-30 MED ORDER — FAMOTIDINE IN NACL 20-0.9 MG/50ML-% IV SOLN: 20.0000 mg | Freq: Once | INTRAVENOUS | Status: DC | PRN

## 2018-12-30 MED ORDER — ACETAMINOPHEN 325 MG PO TABS
ORAL_TABLET | ORAL | Status: AC
  Filled 2018-12-30: qty 1

## 2018-12-30 MED ORDER — EPINEPHRINE 0.3 MG/0.3ML IJ SOAJ: 0.3000 mg | Freq: Once | INTRAMUSCULAR | Status: DC | PRN

## 2018-12-30 MED ORDER — DIPHENHYDRAMINE HCL 50 MG/ML IJ SOLN: 50.0000 mg | Freq: Once | INTRAMUSCULAR | Status: DC | PRN

## 2018-12-30 NOTE — Progress Notes (Signed)
  Diagnosis: COVID-19  Physician:mannam Procedure: [facility] Infusion [facility] Med: bamlanivimab infusion - Provided patient with bamlanimivab fact sheet for patients, parents and caregivers prior to infusion.  Complications: No immediate complications noted.  Discharge: Discharged home   [name] 12/30/2018 Patient ID: [name] [name], female   DOB: 28-Nov-1970, 48 y.o.   MRN: [id number]

## 2018-12-30 NOTE — Progress Notes (Signed)
Re-checked temp 98.5 orally. Feels better , headache down from 8 to 5 .

## 2018-12-30 NOTE — Progress Notes (Signed)
C/o of headache , post infusion. States , not new for her esp this time of day. Temp 99.7 orally. See vitals. Given tylenol po.

## 2019-01-11 ENCOUNTER — Other Ambulatory Visit: Payer: Self-pay

## 2019-01-19 ENCOUNTER — Other Ambulatory Visit: Payer: Self-pay

## 2019-08-30 ENCOUNTER — Encounter (HOSPITAL_BASED_OUTPATIENT_CLINIC_OR_DEPARTMENT_OTHER): Payer: Self-pay | Admitting: Emergency Medicine

## 2019-08-30 ENCOUNTER — Emergency Department (HOSPITAL_BASED_OUTPATIENT_CLINIC_OR_DEPARTMENT_OTHER)
Admission: EM | Admit: 2019-08-30 | Discharge: 2019-08-30 | Disposition: A | Discharge status: Home / Self Care | Payer: Medicaid Other | Attending: Emergency Medicine | Admitting: Emergency Medicine

## 2019-08-30 ENCOUNTER — Emergency Department (HOSPITAL_BASED_OUTPATIENT_CLINIC_OR_DEPARTMENT_OTHER): Payer: Medicaid Other

## 2019-08-30 ENCOUNTER — Other Ambulatory Visit: Payer: Self-pay

## 2019-08-30 DIAGNOSIS — R42 Dizziness and giddiness: Secondary | ICD-10-CM | POA: Diagnosis not present

## 2019-08-30 DIAGNOSIS — I10 Essential (primary) hypertension: Secondary | ICD-10-CM | POA: Insufficient documentation

## 2019-08-30 DIAGNOSIS — R1110 Vomiting, unspecified: Secondary | ICD-10-CM | POA: Diagnosis not present

## 2019-08-30 DIAGNOSIS — E119 Type 2 diabetes mellitus without complications: Secondary | ICD-10-CM | POA: Diagnosis not present

## 2019-08-30 DIAGNOSIS — Z79899 Other long term (current) drug therapy: Secondary | ICD-10-CM | POA: Insufficient documentation

## 2019-08-30 LAB — CBC WITH DIFFERENTIAL/PLATELET
Abs Immature Granulocytes: 0.02 10*3/uL (ref 0.00–0.07)
Basophils Absolute: 0 10*3/uL (ref 0.0–0.1)
Basophils Relative: 0 %
Eosinophils Absolute: 0 10*3/uL (ref 0.0–0.5)
Eosinophils Relative: 0 %
HCT: 40.3 % (ref 36.0–46.0)
Hemoglobin: 12.8 g/dL (ref 12.0–15.0)
Immature Granulocytes: 0 %
Lymphocytes Relative: 13 %
Lymphs Abs: 0.8 10*3/uL (ref 0.7–4.0)
MCH: 27.4 pg (ref 26.0–34.0)
MCHC: 31.8 g/dL (ref 30.0–36.0)
MCV: 86.1 fL (ref 80.0–100.0)
Monocytes Absolute: 0.3 10*3/uL (ref 0.1–1.0)
Monocytes Relative: 5 %
Neutro Abs: 4.7 10*3/uL (ref 1.7–7.7)
Neutrophils Relative %: 82 %
Platelets: 240 10*3/uL (ref 150–400)
RBC: 4.68 MIL/uL (ref 3.87–5.11)
RDW: 14.3 % (ref 11.5–15.5)
WBC: 5.8 10*3/uL (ref 4.0–10.5)
nRBC: 0 % (ref 0.0–0.2)

## 2019-08-30 LAB — COMPREHENSIVE METABOLIC PANEL
ALT: 22 U/L (ref 0–44)
AST: 21 U/L (ref 15–41)
Albumin: 3.5 g/dL (ref 3.5–5.0)
Alkaline Phosphatase: 65 U/L (ref 38–126)
Anion gap: 9 (ref 5–15)
BUN: 9 mg/dL (ref 6–20)
CO2: 27 mmol/L (ref 22–32)
Calcium: 8.8 mg/dL — ABNORMAL LOW (ref 8.9–10.3)
Chloride: 103 mmol/L (ref 98–111)
Creatinine, Ser: 1.02 mg/dL — ABNORMAL HIGH (ref 0.44–1.00)
GFR calc Af Amer: >60 mL/min (ref >60)
GFR calc non Af Amer: >60 mL/min (ref >60)
Glucose, Bld: 151 mg/dL — ABNORMAL HIGH (ref 70–99)
Potassium: 3.9 mmol/L (ref 3.5–5.1)
Sodium: 139 mmol/L (ref 135–145)
Total Bilirubin: 0.6 mg/dL (ref 0.3–1.2)
Total Protein: 7.1 g/dL (ref 6.5–8.1)

## 2019-08-30 LAB — LIPASE, BLOOD: Lipase: 21 U/L (ref 11–51)

## 2019-08-30 LAB — CBG MONITORING, ED: Glucose-Capillary: 140 mg/dL — ABNORMAL HIGH (ref 70–99)

## 2019-08-30 MED ORDER — MECLIZINE HCL 25 MG PO TABS
25.0000 mg | ORAL_TABLET | Freq: Once | ORAL | Status: AC
  Administered 2019-08-30: 25 mg via ORAL
  Filled 2019-08-30: qty 1

## 2019-08-30 MED ORDER — SODIUM CHLORIDE 0.9 % IV BOLUS
500.0000 mL | Freq: Once | INTRAVENOUS | Status: AC
  Administered 2019-08-30: 500 mL via INTRAVENOUS

## 2019-08-30 MED ORDER — MECLIZINE HCL 25 MG PO TABS: 25.0000 mg | ORAL_TABLET | Freq: Three times a day (TID) | ORAL | 0 refills | Status: AC | PRN

## 2019-08-30 NOTE — ED Notes (Signed)
Patient ambulated to bathroom. Stable with assistance of cane which she uses at home. Denies dizziness at this time. Able to hold down fluid and food.

## 2019-08-30 NOTE — ED Provider Notes (Signed)
Emergency Department Provider Note   I have reviewed the triage vital signs and the nursing notes.   HISTORY  Chief Complaint Dizziness and Emesis   HPI [name] [name] is a 49 y.o. female with PMH of DM and HTN presents to the emergency department for evaluation of acute onset vertigo with vomiting and tinnitus on the right.  Patient has no prior history of vertigo.  Denies any obvious provoking factors.  Symptoms are worse with movement but does have some vertigo type symptoms at rest.  She denies any severe headache, unilateral weakness, numbness.  Denies any speech change.  She notes some decreased hearing on the right as well in addition to the ear ringing.  No radiation of symptoms or other modifying factors.  Past Medical History:  Diagnosis Date  . Anemia   . Diabetes mellitus without complication ([facility])   . Hypertension   . Pneumonia    pt to ER 07/14/2013 for community acquired pneumonia pt denies having pneumonia     Patient Active Problem List   Diagnosis Date Noted  . HNP (herniated nucleus pulposus), lumbar 10/25/2013    Past Surgical History:  Procedure Laterality Date  . LUMBAR LAMINECTOMY/DECOMPRESSION MICRODISCECTOMY Bilateral 10/25/2013   Procedure: MICRO LUMBAR DECOMPRESSION L5-S1 BILATERAL;  Surgeon: Javier Docker, MD;  Location: [facility];  Service: Orthopedics;  Laterality: Bilateral;  . TUBAL LIGATION      Allergies Patient has no known allergies.  No family history on file.  Social History Social History   Tobacco Use  . Smoking status: Never Smoker  . Smokeless tobacco: Never Used  Vaping Use  . Vaping Use: Never used  Substance Use Topics  . Alcohol use: No  . Drug use: No    Review of Systems  Constitutional: No fever/chills Eyes: No visual changes. ENT: No sore throat. Positive tinnitus and decreased hearing on the right.  Cardiovascular: Denies chest pain. Respiratory: Denies shortness of breath. Gastrointestinal: No abdominal  pain. Positive nausea and vomiting.  No diarrhea.  No constipation. Genitourinary: Negative for dysuria. Musculoskeletal: Negative for back pain. Skin: Negative for rash. Neurological: Negative for headaches, focal weakness or numbness.  10-point ROS otherwise negative.  ____________________________________________   PHYSICAL EXAM:  VITAL SIGNS: ED Triage Vitals  Enc Vitals Group     BP 08/30/19 1801 116/81     Pulse Rate 08/30/19 1801 88     Resp 08/30/19 1801 16     Temp 08/30/19 1801 98.4 F (36.9 C)     Temp Source 08/30/19 1801 Oral     SpO2 08/30/19 1801 99 %     Weight 08/30/19 1802 260 lb (117.9 kg)     Height 08/30/19 1802 5\' 6"  (1.676 m)   Constitutional: Alert and oriented. Well appearing and in no acute distress. Eyes: Conjunctivae are normal. Mild nystagmus when looking right. No rotary or horizontal nystagmus. EOMI. PERRL (33mm).  Head: Atraumatic. Nose: No congestion/rhinnorhea. Mouth/Throat: Mucous membranes are moist.   Cardiovascular: Normal rate, regular rhythm. Good peripheral circulation. Grossly normal heart sounds.   Respiratory: Normal respiratory effort.  No retractions. Lungs CTAB. Gastrointestinal: Soft and nontender. No distention.  Musculoskeletal: No lower extremity tenderness nor edema. No gross deformities of extremities. Neurologic:  Normal speech and language. No gross focal neurologic deficits are appreciated.  No facial asymmetry or sensory deficit.  Normal finger-to-nose testing with bilateral upper extremities.  Normal rapid alternating movements.  Normal heel-to-shin testing bilaterally.  Patient with 5 out of 5 strength in the  upper and lower extremities with normal sensation.  Skin:  Skin is warm, dry and intact. No rash noted.   ____________________________________________   LABS (all labs ordered are listed, but only abnormal results are displayed)  Labs Reviewed  COMPREHENSIVE METABOLIC PANEL - Abnormal; Notable for the following  components:      Result Value   Glucose, Bld 151 (*)    Creatinine, Ser 1.02 (*)    Calcium 8.8 (*)    All other components within normal limits  CBG MONITORING, ED - Abnormal; Notable for the following components:   Glucose-Capillary 140 (*)    All other components within normal limits  CBC WITH DIFFERENTIAL/PLATELET  LIPASE, BLOOD   ____________________________________________  EKG  None ____________________________________________  RADIOLOGY  CT head w/o contrast reviewed. No acute findings.  ____________________________________________   PROCEDURES  Procedure(s) performed:   Procedures  None ____________________________________________   INITIAL IMPRESSION / ASSESSMENT AND PLAN / ED COURSE  Pertinent labs & imaging results that were available during my care of the patient were reviewed by me and considered in my medical decision making (see chart for details).   Patient presents to the emergency department with acute onset vertigo symptoms with tinnitus and decreased hearing on the right.  Her neurologic exam is normal.  She is having some vomiting in the waiting room.  My suspicion for central vertigo is very low but she does have several risk factors for stroke.  Plan for Noncon CT imaging of the head along with labs, meclizine, reassess.  CT imaging and labs reviewed. Patient significantly improved with Meclizine. Improved with meds and ambulatory in the ED with steady gait. Gave contact for ENT and will f/u with PCP this week. Discussed ED return precautions.  ____________________________________________  FINAL CLINICAL IMPRESSION(S) / ED DIAGNOSES  Final diagnoses:  Vertigo     MEDICATIONS GIVEN DURING THIS VISIT:  Medications  sodium chloride 0.9 % bolus 500 mL ( Intravenous Stopped 08/30/19 2106)  meclizine (ANTIVERT) tablet 25 mg (25 mg Oral Given 08/30/19 1852)     NEW OUTPATIENT MEDICATIONS STARTED DURING THIS VISIT:  Discharge Medication List  as of 08/30/2019  9:48 PM    START taking these medications   Details  meclizine (ANTIVERT) 25 MG tablet Take 1 tablet (25 mg total) by mouth 3 (three) times daily as needed for dizziness., Starting Wed 08/30/2019, Normal        Note:  This document was prepared using Dragon voice recognition software and may include unintentional dictation errors.  Alona Bene, MD, [facility] Emergency Medicine    Shakinah Navis, Arlyss Repress, MD 09/04/19 1052

## 2019-08-30 NOTE — ED Notes (Addendum)
PT Ambulated with steady gait and no assistance

## 2019-08-30 NOTE — ED Notes (Signed)
Reviewed SX with MD. Pt to be put in  A room .

## 2019-08-30 NOTE — ED Notes (Signed)
Patient transported to CT 

## 2019-08-30 NOTE — ED Triage Notes (Signed)
Sudden onset of dizziness with vomiting 2 hours ago.PT with eyes closed in triage. Small emesis. Speech clear. Also c/o back pain chronic. VSS.

## 2019-08-30 NOTE — Discharge Instructions (Signed)
We believe your symptoms were caused by benign vertigo.  Please read through the included information and take any prescribed medication(s).  Follow up with your doctor as listed above.  If you develop any new or worsening symptoms that concern you, including but not limited to persistent dizziness/vertigo, numbness or weakness in your arms or legs, altered mental status, persistent vomiting, or fever greater than 101, please return immediately to the Emergency Department.  

## 2019-08-30 NOTE — ED Notes (Signed)
Attempted IV start x2, unsuccessful. 

## 2021-03-09 IMAGING — CT CT HEAD W/O CM
3 series · 15 of 46 positions shown, 18 images · non-contrast
Comparison: None.

CLINICAL DATA: Dizziness and vomiting for 2 hours.

EXAM:
CT HEAD WITHOUT CONTRAST
TECHNIQUE: Contiguous axial images were obtained from the base of the skull
through the vertex without intravenous contrast.

[Series 2: head wo · axial · 0.43mm/px · z∈[+314,+434]mm · 9 of 29 slices shown, 12 images]
[im 3/29  brain]
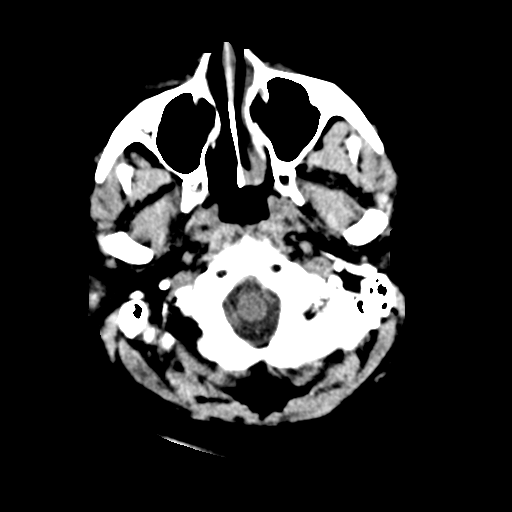
[im 3/29  bone]
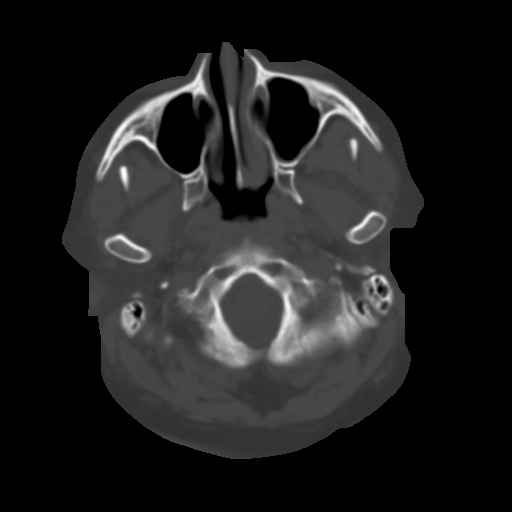
[im 6/29  brain]
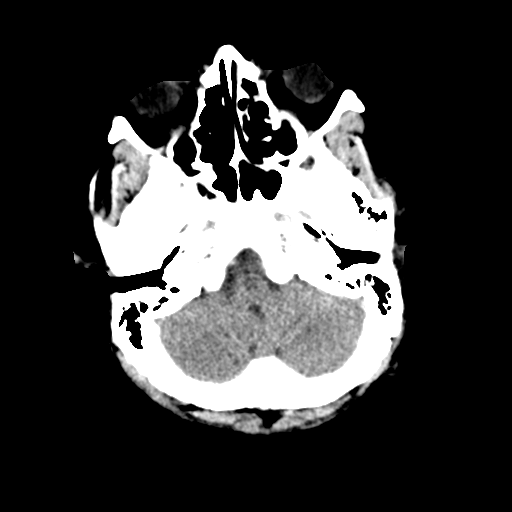
[im 9/29  brain]
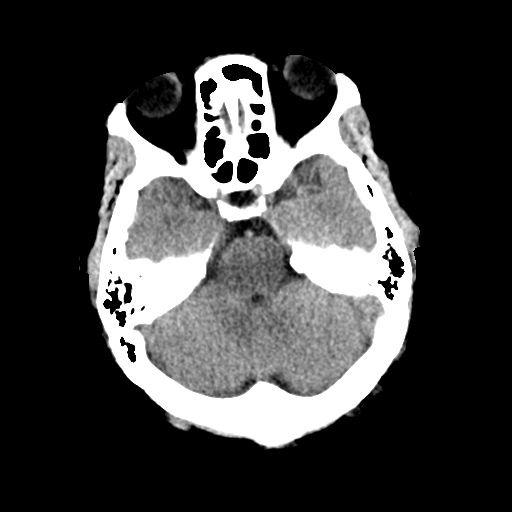
[im 12/29  brain]
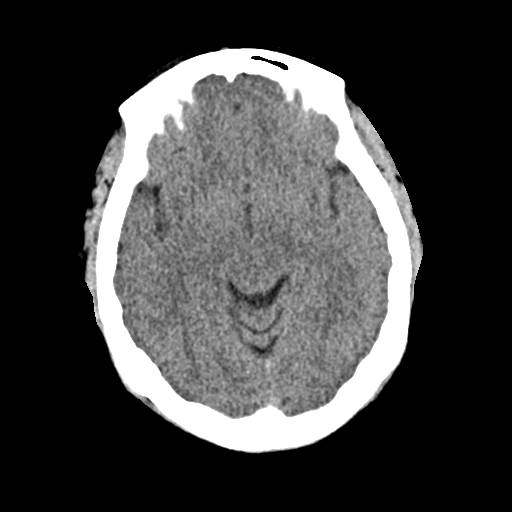
[im 15/29  brain]
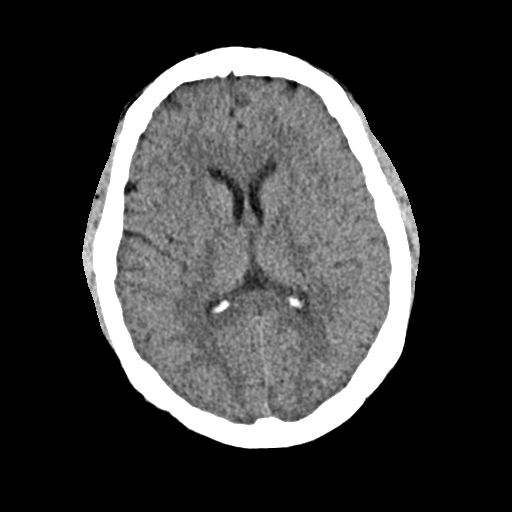
[im 15/29  bone]
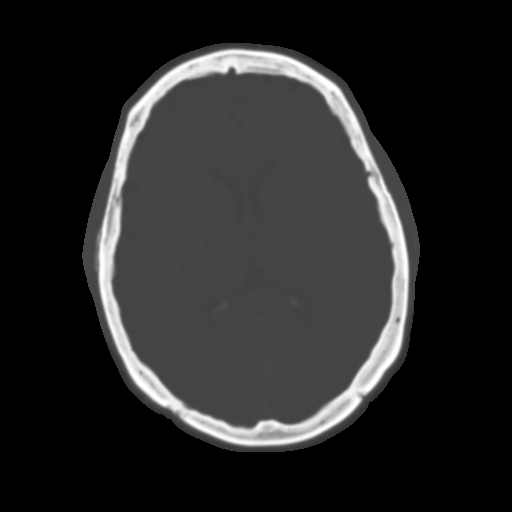
[im 18/29  brain]
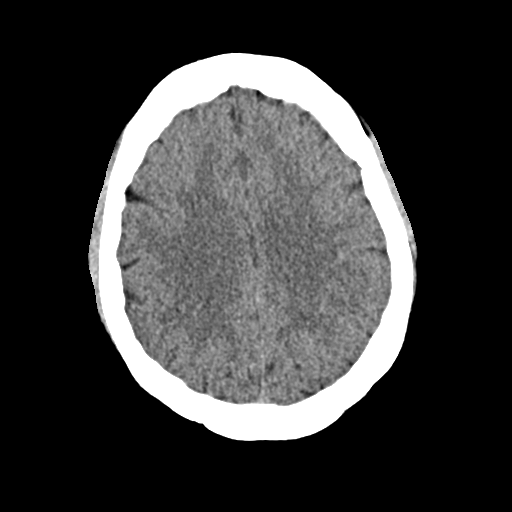
[im 21/29  brain]
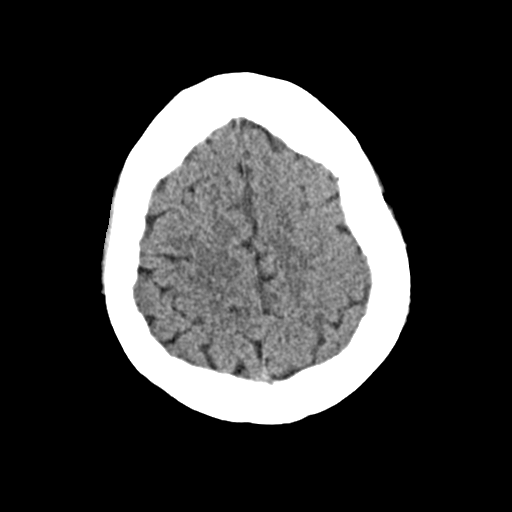
[im 24/29  brain]
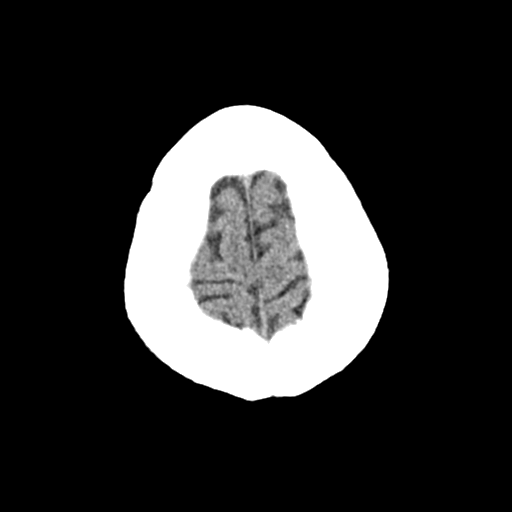
[im 27/29  brain]
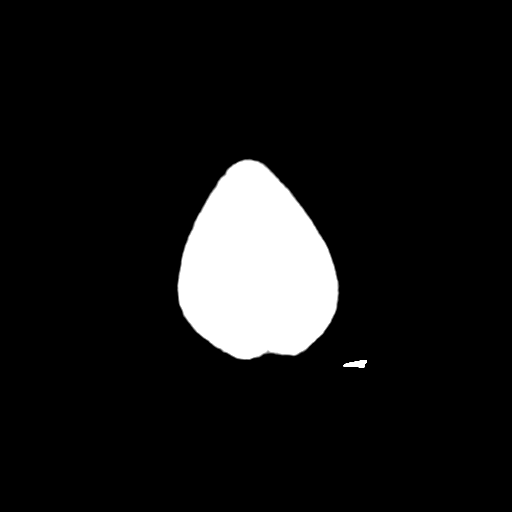
[im 27/29  bone]
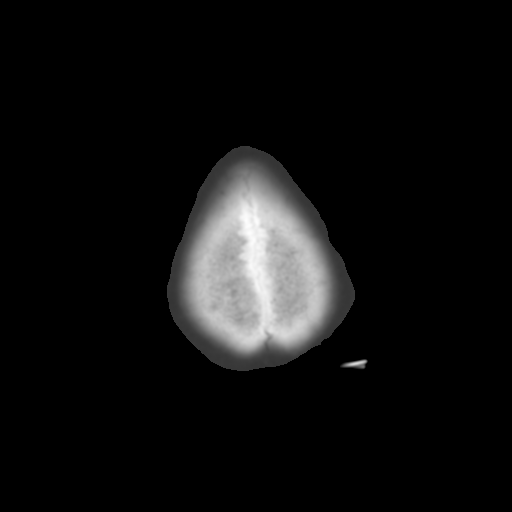

[Series 4: coronal soft · coronal · 0.29mm/px · 3 of 69 slices shown]
[im 23/69  brain]
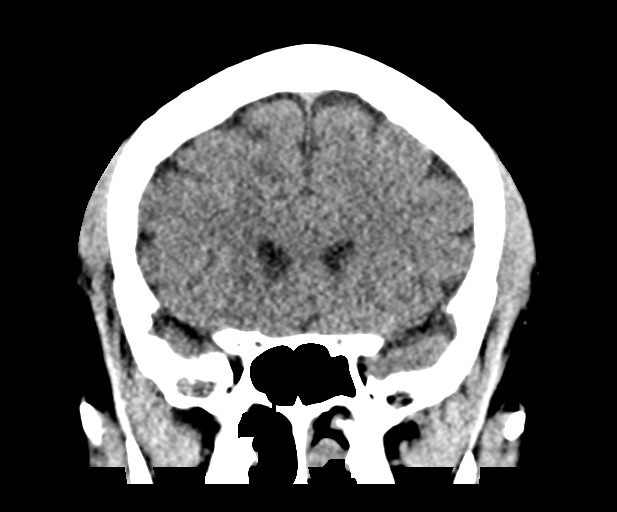
[im 31/69  brain]
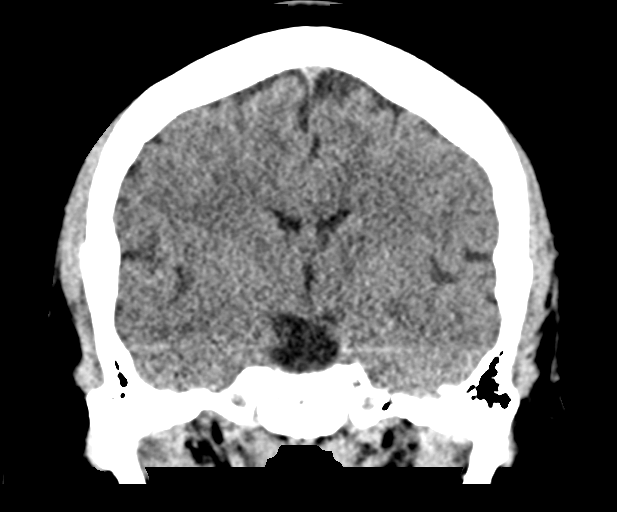
[im 38/69  brain]
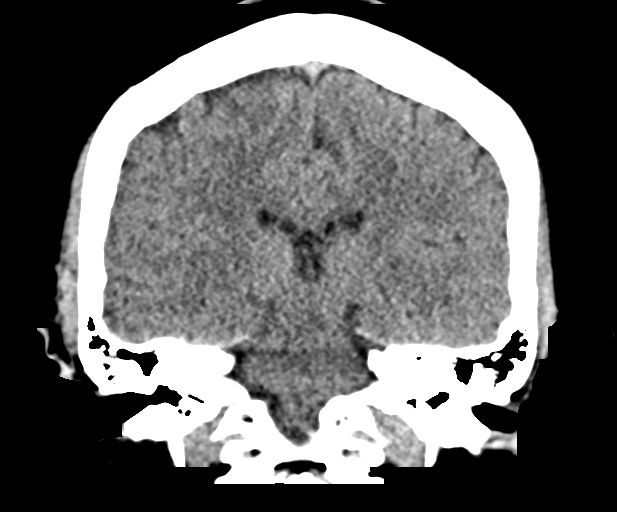

[Series 5: sag soft · sagittal · 0.28mm/px · 3 of 59 slices shown]
[im 20/59  brain]
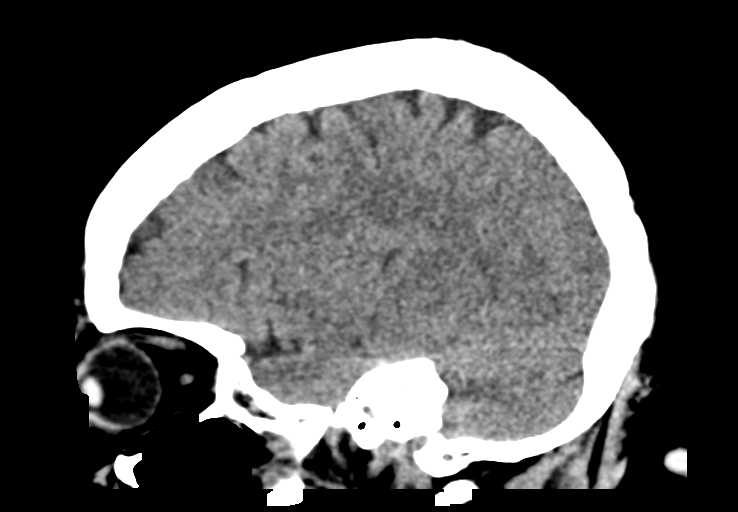
[im 30/59  brain]
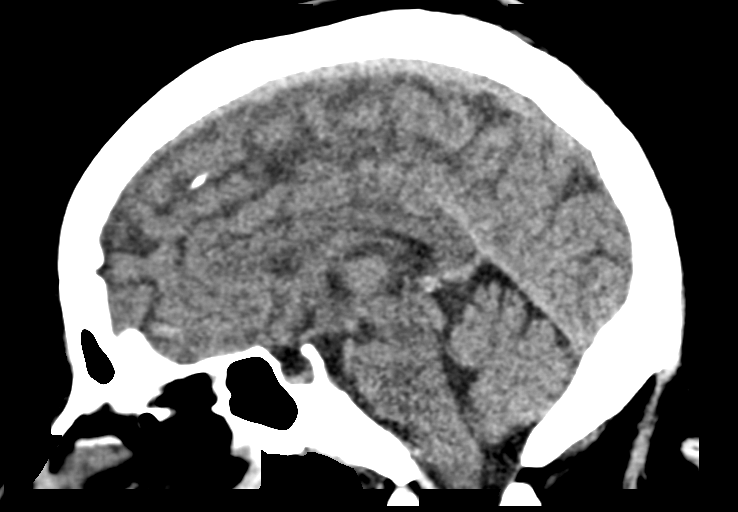
[im 39/59  brain]
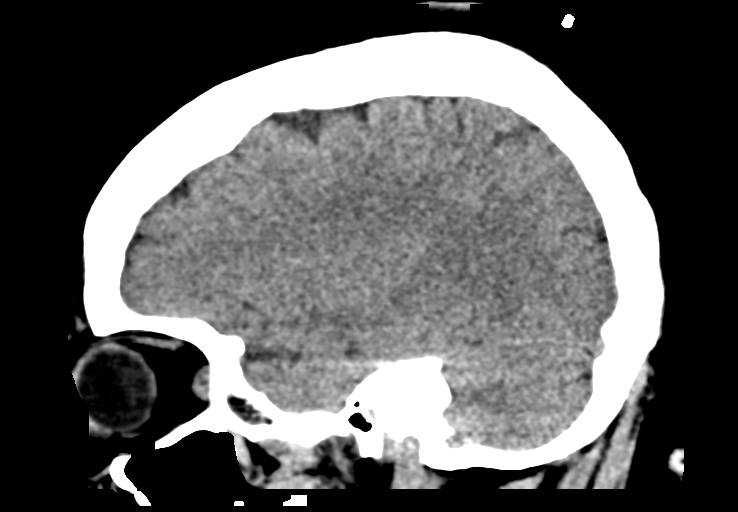

[15 of 46 positions shown; findings below may reference images not displayed]

FINDINGS: Brain: The ventricles are normal in size and configuration. No
extra-axial fluid collections are identified. The gray-white
differentiation is maintained. No CT findings for acute hemispheric
infarction or intracranial hemorrhage. No mass lesions. The
brainstem and cerebellum are normal.

Vascular: No hyperdense vessels or obvious aneurysm.

Skull: No acute skull fracture.  No bone lesion.

Sinuses/Orbits: The paranasal sinuses and mastoid air cells are
clear. The globes are intact.

Other: No scalp lesions, laceration or hematoma.
IMPRESSION: Normal head CT.

## 2021-06-21 ENCOUNTER — Emergency Department (HOSPITAL_BASED_OUTPATIENT_CLINIC_OR_DEPARTMENT_OTHER)
Admission: EM | Admit: 2021-06-21 | Discharge: 2021-06-21 | Disposition: A | Discharge status: Home / Self Care | Payer: Medicare Other | Attending: Emergency Medicine | Admitting: Emergency Medicine

## 2021-06-21 ENCOUNTER — Encounter (HOSPITAL_BASED_OUTPATIENT_CLINIC_OR_DEPARTMENT_OTHER): Payer: Self-pay | Admitting: Emergency Medicine

## 2021-06-21 DIAGNOSIS — H9311 Tinnitus, right ear: Secondary | ICD-10-CM | POA: Diagnosis not present

## 2021-06-21 DIAGNOSIS — R42 Dizziness and giddiness: Secondary | ICD-10-CM | POA: Diagnosis not present

## 2021-06-21 DIAGNOSIS — Z79899 Other long term (current) drug therapy: Secondary | ICD-10-CM | POA: Insufficient documentation

## 2021-06-21 MED ORDER — MECLIZINE HCL 25 MG PO TABS
25.0000 mg | ORAL_TABLET | Freq: Once | ORAL | Status: AC
  Administered 2021-06-21: 25 mg via ORAL
  Filled 2021-06-21: qty 1

## 2021-06-21 MED ORDER — SODIUM CHLORIDE 0.9 % IV BOLUS
1000.0000 mL | Freq: Once | INTRAVENOUS | Status: AC
  Administered 2021-06-21: 1000 mL via INTRAVENOUS

## 2021-06-21 NOTE — Discharge Instructions (Signed)
Your exam today was reassuring.  Symptoms improved with meclizine, and IV fluids.  Continue taking meclizine as needed for dizziness.  You state you have supply of meclizine at home.  If you have any worsening symptoms you can return to the emergency room otherwise follow-up with your primary care provider or your ENT.

## 2021-06-21 NOTE — ED Provider Notes (Signed)
[facility] EMERGENCY DEPARTMENT Provider Note   CSN: [id number] Arrival date & time: 06/21/21  1806     History  Chief Complaint  Patient presents with   Dizziness    [name] [name] is a 51 y.o. female.  51 year old female presents today for evaluation of sudden onset of dizziness, tinnitus in the right ear.  States she infrequently gets these episodes however they never lasted this long.  She denies any recent illness.  Denies any changes to her medications.  She states that started following lunch around 4:15 PM.  Denies headache, visual change.  The history is provided by the patient. No language interpreter was used.       Home Medications Prior to Admission medications   Medication Sig Start Date End Date Taking? Authorizing Provider  allopurinol (ZYLOPRIM) 100 MG tablet Take 200 mg by mouth daily. 08/10/19   [provider]  atorvastatin (LIPITOR) 20 MG tablet SMARTSIG:1 Tablet(s) By Mouth Every Evening 08/10/19   [provider]  benzonatate (TESSALON) 100 MG capsule Take 1 capsule (100 mg total) by mouth every 8 (eight) hours. 12/28/18   Ronnie Doss A, PA-C  escitalopram (LEXAPRO) 10 MG tablet Take 10 mg by mouth daily.    [provider]  HYDRALAZINE-HCTZ PO Take by mouth.    [provider]  hydrochlorothiazide (HYDRODIURIL) 25 MG tablet Take 25 mg by mouth daily.    [provider]  HYDROcodone-acetaminophen (NORCO/VICODIN) 5-325 MG tablet Take 1 tablet by mouth every 6 (six) hours as needed. 08/02/19   [provider]  ibuprofen (ADVIL,MOTRIN) 600 MG tablet Take 1 tablet (600 mg total) by mouth 3 (three) times daily. 09/27/16   Hedges, Tinnie Gens, PA-C  IRON PO Take by mouth.    [provider]  losartan (COZAAR) 100 MG tablet Take 100 mg by mouth daily. 08/10/19   [provider]  meclizine (ANTIVERT) 25 MG tablet Take 1 tablet (25 mg total) by mouth 3 (three) times daily as needed for  dizziness. 08/30/19   Long, Arlyss Repress, MD  METFORMIN HCL PO Take by mouth.    [provider]  ondansetron (ZOFRAN) 4 MG tablet Take 1 tablet (4 mg total) by mouth every 6 (six) hours. 12/28/18   Arlyn Dunning, PA-C      Allergies    Patient has no known allergies.    Review of Systems   Review of Systems  Constitutional:  Negative for chills and fever.  HENT:  Positive for tinnitus. Negative for ear discharge and ear pain.   Eyes:  Negative for visual disturbance.  Neurological:  Positive for dizziness. Negative for light-headedness and headaches.  All other systems reviewed and are negative.   Physical Exam Updated Vital Signs BP (!) 143/85   Pulse 72   Temp 97.7 F (36.5 C) (Oral)   Resp 20   Ht 5\' 6"  (1.676 m)   Wt 125.2 kg   LMP 11/28/2015   SpO2 97%   BMI 44.55 kg/m  Physical Exam Vitals and nursing note reviewed.  Constitutional:      General: She is not in acute distress.    Appearance: Normal appearance. She is not ill-appearing.  HENT:     Head: Normocephalic and atraumatic.     Nose: Nose normal.  Eyes:     General: No scleral icterus.    Extraocular Movements: Extraocular movements intact.     Conjunctiva/sclera: Conjunctivae normal.     Pupils: Pupils are equal, round, and reactive  to light.  Cardiovascular:     Rate and Rhythm: Normal rate and regular rhythm.     Pulses: Normal pulses.     Heart sounds: Normal heart sounds.  Pulmonary:     Effort: Pulmonary effort is normal. No respiratory distress.     Breath sounds: Normal breath sounds. No wheezing or rales.  Abdominal:     General: There is no distension.     Tenderness: There is no abdominal tenderness.  Musculoskeletal:        General: Normal range of motion.     Cervical back: Normal range of motion.  Skin:    General: Skin is warm and dry.  Neurological:     General: No focal deficit present.     Mental Status: She is alert. Mental status is at baseline.     Comments: Cranial  nerves III through XII intact.  Without facial droop.  Without dysarthria.  Full range of motion of bilateral upper and lower extremities.  Sensation intact and symmetrical.  Without pronator drift.  Pupils equal round reactive to light.  EOM's intact.  Without nystagmus.     ED Results / Procedures / Treatments   Labs (all labs ordered are listed, but only abnormal results are displayed) Labs Reviewed - No data to display  EKG None  Radiology No results found.  Procedures Procedures    Medications Ordered in ED Medications  sodium chloride 0.9 % bolus 1,000 mL (has no administration in time range)  meclizine (ANTIVERT) tablet 25 mg (has no administration in time range)    ED Course/ Medical Decision Making/ A&P                           Medical Decision Making  51 year old female presents today for evaluation of dizziness, tinnitus in the right ear.  Onset around 415 today.  Denies headache, visual change, recent illness, difficulty with speech, unilateral weakness.  Has history of similar symptoms however states they have never lasted this long.  Neurological exam is nonfocal.  Without symptoms concerning for CVA.  Will provide meclizine, and fluids and reevaluate.  On reevaluation patient reports resolution of dizziness and tenderness.  Patient states she has meclizine at home.  Discussed importance of follow-up with PCP.  She also has ENT to follow-up with as needed.  Return precautions discussed.  Patient voices understanding and is in agreement with plan.   Final Clinical Impression(s) / ED Diagnoses Final diagnoses:  Vertigo    Rx / DC Orders ED Discharge Orders     None         Marita Kansas, PA-C 06/21/21 2046    Terald Sleeper, MD 06/21/21 2134

## 2021-06-21 NOTE — ED Triage Notes (Signed)
Pt reports dizziness and vomiting since 1615 today; hx of vertigo

## 2021-11-13 ENCOUNTER — Emergency Department (HOSPITAL_BASED_OUTPATIENT_CLINIC_OR_DEPARTMENT_OTHER): Payer: Medicare Other

## 2021-11-13 ENCOUNTER — Emergency Department (HOSPITAL_BASED_OUTPATIENT_CLINIC_OR_DEPARTMENT_OTHER)
Admission: EM | Admit: 2021-11-13 | Discharge: 2021-11-13 | Disposition: A | Discharge status: Home / Self Care | Payer: Medicare Other | Attending: Emergency Medicine | Admitting: Emergency Medicine

## 2021-11-13 ENCOUNTER — Other Ambulatory Visit: Payer: Self-pay

## 2021-11-13 ENCOUNTER — Encounter (HOSPITAL_BASED_OUTPATIENT_CLINIC_OR_DEPARTMENT_OTHER): Payer: Self-pay | Admitting: Emergency Medicine

## 2021-11-13 ENCOUNTER — Emergency Department (HOSPITAL_COMMUNITY): Payer: Medicare Other

## 2021-11-13 DIAGNOSIS — E119 Type 2 diabetes mellitus without complications: Secondary | ICD-10-CM | POA: Diagnosis not present

## 2021-11-13 DIAGNOSIS — M545 Low back pain, unspecified: Secondary | ICD-10-CM | POA: Diagnosis not present

## 2021-11-13 DIAGNOSIS — Y92410 Unspecified street and highway as the place of occurrence of the external cause: Secondary | ICD-10-CM | POA: Diagnosis not present

## 2021-11-13 DIAGNOSIS — M25532 Pain in left wrist: Secondary | ICD-10-CM | POA: Diagnosis not present

## 2021-11-13 DIAGNOSIS — Z7984 Long term (current) use of oral hypoglycemic drugs: Secondary | ICD-10-CM | POA: Insufficient documentation

## 2021-11-13 DIAGNOSIS — Z79899 Other long term (current) drug therapy: Secondary | ICD-10-CM | POA: Insufficient documentation

## 2021-11-13 DIAGNOSIS — R519 Headache, unspecified: Secondary | ICD-10-CM | POA: Insufficient documentation

## 2021-11-13 DIAGNOSIS — M25531 Pain in right wrist: Secondary | ICD-10-CM | POA: Diagnosis not present

## 2021-11-13 DIAGNOSIS — I10 Essential (primary) hypertension: Secondary | ICD-10-CM | POA: Diagnosis not present

## 2021-11-13 DIAGNOSIS — M542 Cervicalgia: Secondary | ICD-10-CM | POA: Insufficient documentation

## 2021-11-13 MED ORDER — IBUPROFEN 800 MG PO TABS
800.0000 mg | ORAL_TABLET | Freq: Once | ORAL | Status: AC
  Administered 2021-11-13: 800 mg via ORAL
  Filled 2021-11-13: qty 1
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
## 2021-12-29 ENCOUNTER — Encounter (HOSPITAL_BASED_OUTPATIENT_CLINIC_OR_DEPARTMENT_OTHER): Payer: Self-pay | Admitting: Urology

## 2021-12-29 ENCOUNTER — Emergency Department (HOSPITAL_BASED_OUTPATIENT_CLINIC_OR_DEPARTMENT_OTHER)
Admission: EM | Admit: 2021-12-29 | Discharge: 2021-12-29 | Disposition: A | Discharge status: Home / Self Care | Payer: Medicare Other | Attending: Emergency Medicine | Admitting: Emergency Medicine

## 2021-12-29 ENCOUNTER — Other Ambulatory Visit: Payer: Self-pay

## 2021-12-29 ENCOUNTER — Emergency Department (HOSPITAL_BASED_OUTPATIENT_CLINIC_OR_DEPARTMENT_OTHER): Payer: Medicare Other

## 2021-12-29 DIAGNOSIS — S93401A Sprain of unspecified ligament of right ankle, initial encounter: Secondary | ICD-10-CM | POA: Diagnosis not present

## 2021-12-29 DIAGNOSIS — M109 Gout, unspecified: Secondary | ICD-10-CM | POA: Diagnosis not present

## 2021-12-29 DIAGNOSIS — M79671 Pain in right foot: Secondary | ICD-10-CM | POA: Diagnosis present

## 2021-12-29 DIAGNOSIS — W19XXXA Unspecified fall, initial encounter: Secondary | ICD-10-CM | POA: Insufficient documentation

## 2021-12-29 MED ORDER — COLCHICINE 0.6 MG PO TABS
0.6000 mg | ORAL_TABLET | Freq: Once | ORAL | Status: AC
  Administered 2021-12-29: 0.6 mg via ORAL
  Filled 2021-12-29: qty 1

## 2021-12-29 MED ORDER — PREDNISONE 20 MG PO TABS
20.0000 mg | ORAL_TABLET | Freq: Once | ORAL | Status: AC
  Administered 2021-12-29: 20 mg via ORAL
  Filled 2021-12-29: qty 1
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
## 2021-12-29 NOTE — ED Provider Notes (Signed)
[facility] EMERGENCY DEPARTMENT Provider Note   CSN: [id number] Arrival date & time: 12/29/21  1651     History Chief Complaint  Patient presents with   Foot Injury    HPI [name] [name] is a 51 y.o. female presenting for chief complaint of right foot pain.  She states that on Thursday of last week she fell and has had right ankle pain since then.  She also states that tonight she comes in because she has right great toe pain.  Endorses a history of gout states that this feels very similar to her gout. She is a follow-up with her PCP in the morning but was worried about pain throughout the evening tonight..   Patient's recorded medical, surgical, social, medication list and allergies were reviewed in the Snapshot window as part of the initial history.   Review of Systems   Review of Systems  Constitutional:  Negative for chills and fever.  HENT:  Negative for ear pain and sore throat.   Eyes:  Negative for pain and visual disturbance.  Respiratory:  Negative for cough and shortness of breath.   Cardiovascular:  Negative for chest pain and palpitations.  Gastrointestinal:  Negative for abdominal pain and vomiting.  Genitourinary:  Negative for dysuria and hematuria.  Musculoskeletal:  Negative for arthralgias and back pain.  Skin:  Negative for color change and rash.  Neurological:  Negative for seizures and syncope.  All other systems reviewed and are negative.   Physical Exam Updated Vital Signs BP (!) 131/96 (BP Location: Right Arm)   Pulse (!) 148   Temp 99.1 F (37.3 C) (Oral)   Resp 18   Ht 5\' 6"  (1.676 m)   Wt 128.8 kg   LMP 11/28/2015   SpO2 97%   BMI 45.83 kg/m  Physical Exam Vitals and nursing note reviewed.  Constitutional:      General: She is not in acute distress.    Appearance: She is well-developed.  HENT:     Head: Normocephalic and atraumatic.  Eyes:     Conjunctiva/sclera: Conjunctivae normal.  Cardiovascular:     Rate and  Rhythm: Normal rate and regular rhythm.     Heart sounds: No murmur heard. Pulmonary:     Effort: Pulmonary effort is normal. No respiratory distress.     Breath sounds: Normal breath sounds.  Abdominal:     General: There is no distension.     Palpations: Abdomen is soft.     Tenderness: There is no abdominal tenderness. There is no right CVA tenderness or left CVA tenderness.  Musculoskeletal:        General: No swelling, tenderness (Right greater toe pain.) or signs of injury. Normal range of motion.     Cervical back: Neck supple.  Skin:    General: Skin is warm and dry.  Neurological:     General: No focal deficit present.     Mental Status: She is alert and oriented to person, place, and time. Mental status is at baseline.     Cranial Nerves: No cranial nerve deficit.      ED Course/ Medical Decision Making/ A&P    Procedures Procedures   Medications Ordered in ED Medications  colchicine tablet 0.6 mg (has no administration in time range)  predniSONE (DELTASONE) tablet 20 mg (has no administration in time range)    Medical Decision Making:    [name] is a 51 y.o. female who presented to the ED today with right foot and  right ankle pain detailed above.     Patient's presentation is complicated by their history of multiple comorbid medical problems.  Patient placed on continuous vitals and telemetry monitoring while in ED which was reviewed periodically.   Complete initial physical exam performed, notably the patient  was hemodynamically stable in no acute distress.      Reviewed and confirmed nursing documentation for past medical history, family history, social history.    Initial Assessment:   Patient's history presents with exam findings are most consistent with musculoskeletal strain of the right ankle.  X-ray shows possible healing fracture.  No displacement and is neurovascularly intact.  Stable for conservative management.  Patient's right great toe  pain is more consistent with a gout flare.  She has a history of similar and localized pain at the site.  No systemic fevers or chills for septic arthritis and she is otherwise well-appearing. Will start on colchicine and prednisone and plan to have her reassessed by PCP in the a.m. to ensure ongoing improvement. No other acute indication for intervention at this time.  Reviewed outside hospital labs for prior creatinine that demonstrates normal kidney function.  Clinical Impression:  1. Sprain of right ankle, unspecified ligament, initial encounter   2. Acute gout involving toe of right foot, unspecified cause      Data Unavailable   Final Clinical Impression(s) / ED Diagnoses Final diagnoses:  Sprain of right ankle, unspecified ligament, initial encounter  Acute gout involving toe of right foot, unspecified cause    Rx / DC Orders ED Discharge Orders     None         Glyn Ade, MD 12/29/21 2259

## 2022-04-14 ENCOUNTER — Emergency Department (HOSPITAL_COMMUNITY)
Admission: EM | Admit: 2022-04-14 | Discharge: 2022-04-14 | Disposition: A | Discharge status: Home / Self Care | Payer: 59 | Attending: Emergency Medicine | Admitting: Emergency Medicine

## 2022-04-14 ENCOUNTER — Other Ambulatory Visit: Payer: Self-pay

## 2022-04-14 ENCOUNTER — Encounter (HOSPITAL_COMMUNITY): Payer: Self-pay

## 2022-04-14 DIAGNOSIS — E119 Type 2 diabetes mellitus without complications: Secondary | ICD-10-CM | POA: Insufficient documentation

## 2022-04-14 DIAGNOSIS — Z7984 Long term (current) use of oral hypoglycemic drugs: Secondary | ICD-10-CM | POA: Insufficient documentation

## 2022-04-14 DIAGNOSIS — R1084 Generalized abdominal pain: Secondary | ICD-10-CM | POA: Diagnosis present

## 2022-04-14 DIAGNOSIS — Z79899 Other long term (current) drug therapy: Secondary | ICD-10-CM | POA: Insufficient documentation

## 2022-04-14 DIAGNOSIS — I10 Essential (primary) hypertension: Secondary | ICD-10-CM | POA: Insufficient documentation

## 2022-04-14 DIAGNOSIS — K649 Unspecified hemorrhoids: Secondary | ICD-10-CM | POA: Diagnosis not present

## 2022-04-14 DIAGNOSIS — K5900 Constipation, unspecified: Secondary | ICD-10-CM | POA: Diagnosis not present

## 2022-04-14 MED ORDER — MINERAL OIL RE ENEM
1.0000 | ENEMA | Freq: Once | RECTAL | Status: AC
  Administered 2022-04-14: 1 via RECTAL
  Filled 2022-04-14: qty 1

## 2022-04-14 MED ORDER — SENNOSIDES-DOCUSATE SODIUM 8.6-50 MG PO TABS: 1.0000 | ORAL_TABLET | Freq: Every day | ORAL | 0 refills | Status: AC

## 2022-04-14 MED ORDER — ACETAMINOPHEN 325 MG PO TABS
650.0000 mg | ORAL_TABLET | Freq: Once | ORAL | Status: AC
  Administered 2022-04-14: 650 mg via ORAL
  Filled 2022-04-14: qty 2

## 2022-04-14 MED ORDER — HYDROCORTISONE ACETATE 25 MG RE SUPP: 25.0000 mg | Freq: Two times a day (BID) | RECTAL | 0 refills | Status: AC

## 2022-04-14 NOTE — ED Provider Notes (Signed)
I saw and evaluated the patient, reviewed the resident's note and I agree with the findings and plan.   Patient presents with constipation x 2 days.  Use home medication without relief.  All history of obstruction at this time.  Will give enema and reassess.   Lacretia Leigh, MD 04/14/22 [phone]

## 2022-04-14 NOTE — ED Notes (Signed)
Discharge summary/instructions reviewed with pt. Provided pt with opportunity to ask any questions, pt had no further questions or concerns. After-Visit Summary provided to pt. 

## 2022-04-14 NOTE — ED Triage Notes (Signed)
Constipation for 2 days. Pt states this happened last week and she took miralax and ate some greens and the issue resolved. Tried this again today and has no thad a BM. Pt states she thinks she has a hemorrhoid as well due to "side of rectum feeling hard"

## 2022-04-14 NOTE — ED Provider Notes (Signed)
[facility] Provider Note   CSN: [id number] Arrival date & time: 04/14/22  1755     History  Chief Complaint  Patient presents with   Constipation   Hemorrhoids    [name] [name] is a 52 y.o. female with PMH HTN, T2DM, anemia coming in to the ED for constipation.  She says her constipation started last week and she took MiraLAX once and then had a bowel movement.  She started feeling constipated again this week.  Her last bowel movement was 2 days ago.  She tried taking MiraLAX once yesterday and once this morning without any relief.  She is having some diffuse mild abdominal pain as well.  No nausea vomiting after usual tolerating p.o. intake.  She says she was having some pain around her rectum when she bears down to have a bowel movement.  Has not had any rectal bleeding or melena.  Of note, she takes "4-5" Norco's daily and also was prescribed p.o. iron, though she mentions she has been on this regimen of Norco for quite a while.    Home Medications Prior to Admission medications   Medication Sig Start Date End Date Taking? Authorizing Provider  hydrocortisone (ANUSOL-HC) 25 MG suppository Place 1 suppository (25 mg total) rectally 2 (two) times daily. 04/14/22  Yes Linus Galas, MD  senna-docusate (SENOKOT-S) 8.6-50 MG tablet Take 1 tablet by mouth daily. 04/14/22  Yes Linus Galas, MD  allopurinol (ZYLOPRIM) 100 MG tablet Take 200 mg by mouth daily. 08/10/19   [provider]  atorvastatin (LIPITOR) 20 MG tablet SMARTSIG:1 Tablet(s) By Mouth Every Evening 08/10/19   [provider]  benzonatate (TESSALON) 100 MG capsule Take 1 capsule (100 mg total) by mouth every 8 (eight) hours. 12/28/18   Madilyn Hook A, PA-C  escitalopram (LEXAPRO) 10 MG tablet Take 10 mg by mouth daily.    [provider]  HYDRALAZINE-HCTZ PO Take by mouth.    [provider]  hydrochlorothiazide (HYDRODIURIL) 25 MG  tablet Take 25 mg by mouth daily.    [provider]  HYDROcodone-acetaminophen (NORCO/VICODIN) 5-325 MG tablet Take 1 tablet by mouth every 6 (six) hours as needed. 08/02/19   [provider]  ibuprofen (ADVIL,MOTRIN) 600 MG tablet Take 1 tablet (600 mg total) by mouth 3 (three) times daily. 09/27/16   Hedges, Dellis Filbert, PA-C  IRON PO Take by mouth.    [provider]  losartan (COZAAR) 100 MG tablet Take 100 mg by mouth daily. 08/10/19   [provider]  meclizine (ANTIVERT) 25 MG tablet Take 1 tablet (25 mg total) by mouth 3 (three) times daily as needed for dizziness. 08/30/19   Long, Wonda Olds, MD  METFORMIN HCL PO Take by mouth.    [provider]  ondansetron (ZOFRAN) 4 MG tablet Take 1 tablet (4 mg total) by mouth every 6 (six) hours. 12/28/18   Alveria Apley, PA-C      Allergies    Patient has no known allergies.    Review of Systems   See HPI  Physical Exam Updated Vital Signs BP (!) 142/106   Pulse 70   Temp 98.4 F (36.9 C) (Oral)   Resp 18   Ht 5\' 6"  (1.676 m)   Wt 114.9 kg   LMP 11/28/2015   SpO2 100%   BMI 40.88 kg/m  Physical Exam Constitutional:      General: She is not in acute distress.    Appearance: Normal appearance.  Abdominal:     General: Bowel sounds are normal. There is no distension.     Palpations: Abdomen is soft. There is no mass.     Tenderness: There is no abdominal tenderness. There is no guarding or rebound.     Comments: Hyperactive bowel sounds  Genitourinary:    Rectum: Normal.     Comments: No rectal bleeding or stool.  Palpable hemorrhoids. Neurological:     Mental Status: She is alert.     ED Results / Procedures / Treatments   Labs (all labs ordered are listed, but only abnormal results are displayed) Labs Reviewed - No data to display  EKG None  Radiology No results found.  Procedures  Medications Ordered in ED Medications  acetaminophen (TYLENOL) tablet 650 mg (has no  administration in time range)  mineral oil enema 1 enema (1 enema Rectal Given 04/14/22 2009)    ED Course/ Medical Decision Making/ A&P    Medical Decision Making Risk OTC drugs. Prescription drug management.   [name] [name] is a 52 y.o. female with PMH HTN, T2DM, anemia coming in to the ED for 2 days of constipation.  Her abdominal exam is benign and she has hyperactive bowel sounds.  She has tried MiraLAX once yesterday without success.  Her chronic opioid prescription and p.o. iron are likely contributing to her constipation as well. Will administer enema here and likely dc with senna.  Update: Patient had a large volume bowel movement after soapsuds enema.  She can be discharged with senna as in addition to [facility] as a bowel regimen while she is taking opioids and Anusol for her hemorrhoids.    Final Clinical Impression(s) / ED Diagnoses Final diagnoses:  Hemorrhoids, unspecified hemorrhoid type  Constipation, unspecified constipation type    Rx / DC Orders ED Discharge Orders          Ordered    senna-docusate (SENOKOT-S) 8.6-50 MG tablet  Daily        04/14/22 2131    hydrocortisone (ANUSOL-HC) 25 MG suppository  2 times daily        04/14/22 2131              Lyndle Herrlich, MD 04/14/22 2134    Lorre Nick, MD 04/20/22 [phone]

## 2022-04-14 NOTE — Discharge Instructions (Signed)
Continue to take MiraLAX and Senokot as once a day while you are taking your Norco.  You can use Anusol as needed for your hemorrhoids.  Follow-up with your primary doctor for further management of your constipation.

## 2022-04-14 NOTE — ED Notes (Signed)
Pt had large bowel movement following enema, pt expresses relief. Large bowel movement noted.

## 2022-05-16 ENCOUNTER — Other Ambulatory Visit: Payer: Self-pay

## 2022-05-16 ENCOUNTER — Encounter (HOSPITAL_BASED_OUTPATIENT_CLINIC_OR_DEPARTMENT_OTHER): Payer: Self-pay | Admitting: Emergency Medicine

## 2022-05-16 ENCOUNTER — Emergency Department (HOSPITAL_BASED_OUTPATIENT_CLINIC_OR_DEPARTMENT_OTHER)
Admission: EM | Admit: 2022-05-16 | Discharge: 2022-05-16 | Disposition: A | Discharge status: Home / Self Care | Payer: 59 | Attending: Emergency Medicine | Admitting: Emergency Medicine

## 2022-05-16 DIAGNOSIS — R42 Dizziness and giddiness: Secondary | ICD-10-CM | POA: Insufficient documentation

## 2022-05-16 LAB — CBC
HCT: 37.3 % (ref 36.0–46.0)
Hemoglobin: 12.1 g/dL (ref 12.0–15.0)
MCH: 27.1 pg (ref 26.0–34.0)
MCHC: 32.4 g/dL (ref 30.0–36.0)
MCV: 83.4 fL (ref 80.0–100.0)
Platelets: 220 10*3/uL (ref 150–400)
RBC: 4.47 MIL/uL (ref 3.87–5.11)
RDW: 14.4 % (ref 11.5–15.5)
WBC: 6.9 10*3/uL (ref 4.0–10.5)
nRBC: 0 % (ref 0.0–0.2)

## 2022-05-16 LAB — BASIC METABOLIC PANEL
Anion gap: 8 (ref 5–15)
BUN: 12 mg/dL (ref 6–20)
CO2: 27 mmol/L (ref 22–32)
Calcium: 9.3 mg/dL (ref 8.9–10.3)
Chloride: 105 mmol/L (ref 98–111)
Creatinine, Ser: 1.05 mg/dL — ABNORMAL HIGH (ref 0.44–1.00)
GFR, Estimated: >60 mL/min (ref >60)
Glucose, Bld: 97 mg/dL (ref 70–99)
Potassium: 4.1 mmol/L (ref 3.5–5.1)
Sodium: 140 mmol/L (ref 135–145)

## 2022-05-16 MED ORDER — ONDANSETRON HCL 4 MG/2ML IJ SOLN
4.0000 mg | Freq: Once | INTRAMUSCULAR | Status: AC
  Administered 2022-05-16: 4 mg via INTRAVENOUS
  Filled 2022-05-16: qty 2

## 2022-05-16 MED ORDER — DIAZEPAM 5 MG/ML IJ SOLN
2.5000 mg | Freq: Once | INTRAMUSCULAR | Status: AC
  Administered 2022-05-16: 2.5 mg via INTRAVENOUS
  Filled 2022-05-16: qty 2

## 2022-05-16 MED ORDER — MECLIZINE HCL 25 MG PO TABS
50.0000 mg | ORAL_TABLET | Freq: Once | ORAL | Status: AC
  Administered 2022-05-16: 50 mg via ORAL
  Filled 2022-05-16: qty 2

## 2022-05-16 MED ORDER — SODIUM CHLORIDE 0.9 % IV BOLUS
500.0000 mL | Freq: Once | INTRAVENOUS | Status: AC
  Administered 2022-05-16: 500 mL via INTRAVENOUS

## 2022-05-16 NOTE — ED Provider Notes (Signed)
[place] [facility]PARTMENT AT [facility] Provider Note   CSN: [id number] Arrival date & time: 05/16/22  1146     History  Chief Complaint  Patient presents with   Dizziness    [name] [name] is a 52 y.o. female with a history of recurring vertigo presenting to ED with new onset vertigo.  Patient reports onset of her symptoms abruptly at 10 AM this morning.  Room spinning.  Feels very similar to prior episodes of vertigo.  She has been seen in the ED for this in the past.  She did take 1 meclizine tablet at home but is not certain whether she may have vomited up.  My review of the records, patient seen in the ED in the past, including 08/30/19 and 06/21/21 for vertigo.  CT head unremarkable on 08/30/19 and 11/13/21.  She was managed medically with medications and felt better afterwards.  HPI     Home Medications Prior to Admission medications   Medication Sig Start Date End Date Taking? Authorizing Provider  allopurinol (ZYLOPRIM) 100 MG tablet Take 200 mg by mouth daily. 08/10/19   [provider]  atorvastatin (LIPITOR) 20 MG tablet SMARTSIG:1 Tablet(s) By Mouth Every Evening 08/10/19   [provider]  benzonatate (TESSALON) 100 MG capsule Take 1 capsule (100 mg total) by mouth every 8 (eight) hours. 12/28/18   Ronnie Doss A, PA-C  escitalopram (LEXAPRO) 10 MG tablet Take 10 mg by mouth daily.    [provider]  HYDRALAZINE-HCTZ PO Take by mouth.    [provider]  hydrochlorothiazide (HYDRODIURIL) 25 MG tablet Take 25 mg by mouth daily.    [provider]  HYDROcodone-acetaminophen (NORCO/VICODIN) 5-325 MG tablet Take 1 tablet by mouth every 6 (six) hours as needed. 08/02/19   [provider]  hydrocortisone (ANUSOL-HC) 25 MG suppository Place 1 suppository (25 mg total) rectally 2 (two) times daily. 04/14/22   Lyndle Herrlich, MD  ibuprofen (ADVIL,MOTRIN) 600 MG tablet Take 1 tablet (600 mg total) by mouth 3  (three) times daily. 09/27/16   Hedges, Tinnie Gens, PA-C  IRON PO Take by mouth.    [provider]  losartan (COZAAR) 100 MG tablet Take 100 mg by mouth daily. 08/10/19   [provider]  meclizine (ANTIVERT) 25 MG tablet Take 1 tablet (25 mg total) by mouth 3 (three) times daily as needed for dizziness. 08/30/19   Long, Arlyss Repress, MD  METFORMIN HCL PO Take by mouth.    [provider]  ondansetron (ZOFRAN) 4 MG tablet Take 1 tablet (4 mg total) by mouth every 6 (six) hours. 12/28/18   Ronnie Doss A, PA-C  senna-docusate (SENOKOT-S) 8.6-50 MG tablet Take 1 tablet by mouth daily. 04/14/22   Lyndle Herrlich, MD      Allergies    Patient has no known allergies.    Review of Systems   Review of Systems  Physical Exam Updated Vital Signs BP 131/85   Pulse 80   Temp (!) 97.5 F (36.4 C) (Oral)   Resp 13   Ht 5\' 6"  (1.676 m)   Wt 111.1 kg   LMP 11/28/2015   SpO2 96%   BMI 39.54 kg/m  Physical Exam Constitutional:      General: She is not in acute distress.    Comments: Lying on bed with eyes closed  HENT:     Head: Normocephalic and atraumatic.  Eyes:     Conjunctiva/sclera: Conjunctivae normal.     Pupils: Pupils are  equal, round, and reactive to light.  Cardiovascular:     Rate and Rhythm: Normal rate and regular rhythm.  Pulmonary:     Effort: Pulmonary effort is normal. No respiratory distress.  Abdominal:     General: There is no distension.     Tenderness: There is no abdominal tenderness.  Skin:    General: Skin is warm and dry.  Neurological:     General: No focal deficit present.     Mental Status: She is alert. Mental status is at baseline.     Comments: Unilateral beating nystagmus No skew on testing Head impulse testing equivocal  Psychiatric:        Mood and Affect: Mood normal.        Behavior: Behavior normal.     ED Results / Procedures / Treatments   Labs (all labs ordered are listed, but only abnormal results are  displayed) Labs Reviewed  BASIC METABOLIC PANEL - Abnormal; Notable for the following components:      Result Value   Creatinine, Ser 1.05 (*)    All other components within normal limits  CBC    EKG None  Radiology No results found.  Procedures Procedures    Medications Ordered in ED Medications  ondansetron (ZOFRAN) injection 4 mg (4 mg Intravenous Given 05/16/22 1218)  sodium chloride 0.9 % bolus 500 mL (0 mLs Intravenous Stopped 05/16/22 1331)  diazepam (VALIUM) injection 2.5 mg (2.5 mg Intravenous Given 05/16/22 1221)  meclizine (ANTIVERT) tablet 50 mg (50 mg Oral Given 05/16/22 1220)    ED Course/ Medical Decision Making/ A&P Clinical Course as of 05/16/22 1500  Sat May 16, 2022  1430 Patient reassessed and says she is feeling better but not yet ready to ambulate [MT]  1459 Patient feeling much better and is ambulating steadily, tolerating p.o.  Her ride is coming and she is wanting to go home, which is reasonable. [MT]    Clinical Course User Index [MT] Nikola Blackston, Kermit Balo, MD                             Medical Decision Making Amount and/or Complexity of Data Reviewed Labs: ordered.  Risk Prescription drug management.   Patient is here with vertigo which I suspect is peripheral given her history and her hands exam at the bedside.  We will treat with some IV fluids, IV Valium, IV Zofran for nausea, as well as some oral meclizine if she can tolerate this.  I have lower suspicion for central vertigo or CNS lesion or stroke.  No indication for neuroimaging at this time.  No history consistent with lab otitis or inner ear infection at this time.  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

## 2022-05-16 NOTE — ED Notes (Signed)
D/c paperwork reviewed with pt, including follow up care.  No questions or concerns voiced at time of d/c. . Pt verbalized understanding, Wheeled by ED staff to ED exit, NAD.   

## 2022-05-16 NOTE — ED Notes (Signed)
Pt provided apple juice, graham crackers for PO challenge. Call bell within reach, will continue to monitor.

## 2022-05-16 NOTE — ED Notes (Signed)
Pt denies dizziness at this time.

## 2022-05-16 NOTE — ED Triage Notes (Signed)
Pt to ED via [facility]; Pt reports dizziness and vomiting that started at 1000; sts hx of vertigo and this feels similar; took Meclizine en route to ED and feels it is helping

## 2022-05-16 NOTE — ED Notes (Signed)
Pt ambulatory with NT, no assistance needed, steady gait.

## 2022-06-01 ENCOUNTER — Emergency Department (HOSPITAL_COMMUNITY)
Admission: EM | Admit: 2022-06-01 | Discharge: 2022-06-01 | Disposition: A | Discharge status: Home / Self Care | Payer: 59 | Attending: Emergency Medicine | Admitting: Emergency Medicine

## 2022-06-01 ENCOUNTER — Encounter (HOSPITAL_COMMUNITY): Payer: Self-pay | Admitting: Emergency Medicine

## 2022-06-01 ENCOUNTER — Other Ambulatory Visit: Payer: Self-pay

## 2022-06-01 DIAGNOSIS — E119 Type 2 diabetes mellitus without complications: Secondary | ICD-10-CM | POA: Diagnosis not present

## 2022-06-01 DIAGNOSIS — I10 Essential (primary) hypertension: Secondary | ICD-10-CM | POA: Insufficient documentation

## 2022-06-01 DIAGNOSIS — Z7984 Long term (current) use of oral hypoglycemic drugs: Secondary | ICD-10-CM | POA: Insufficient documentation

## 2022-06-01 DIAGNOSIS — R42 Dizziness and giddiness: Secondary | ICD-10-CM | POA: Diagnosis present

## 2022-06-01 DIAGNOSIS — Z79899 Other long term (current) drug therapy: Secondary | ICD-10-CM | POA: Diagnosis not present

## 2022-06-01 LAB — CBC WITH DIFFERENTIAL/PLATELET
Abs Immature Granulocytes: 0.01 10*3/uL (ref 0.00–0.07)
Basophils Absolute: 0 10*3/uL (ref 0.0–0.1)
Basophils Relative: 0 %
Eosinophils Absolute: 0.1 10*3/uL (ref 0.0–0.5)
Eosinophils Relative: 2 %
HCT: 46.6 % — ABNORMAL HIGH (ref 36.0–46.0)
Hemoglobin: 15 g/dL (ref 12.0–15.0)
Immature Granulocytes: 0 %
Lymphocytes Relative: 26 %
Lymphs Abs: 1.2 10*3/uL (ref 0.7–4.0)
MCH: 26.6 pg (ref 26.0–34.0)
MCHC: 32.2 g/dL (ref 30.0–36.0)
MCV: 82.6 fL (ref 80.0–100.0)
Monocytes Absolute: 0.2 10*3/uL (ref 0.1–1.0)
Monocytes Relative: 5 %
Neutro Abs: 3.1 10*3/uL (ref 1.7–7.7)
Neutrophils Relative %: 67 %
Platelets: 258 10*3/uL (ref 150–400)
RBC: 5.64 MIL/uL — ABNORMAL HIGH (ref 3.87–5.11)
RDW: 14.3 % (ref 11.5–15.5)
WBC: 4.7 10*3/uL (ref 4.0–10.5)
nRBC: 0 % (ref 0.0–0.2)

## 2022-06-01 LAB — BASIC METABOLIC PANEL
Anion gap: 10 (ref 5–15)
BUN: 18 mg/dL (ref 6–20)
CO2: 26 mmol/L (ref 22–32)
Calcium: 9.8 mg/dL (ref 8.9–10.3)
Chloride: 102 mmol/L (ref 98–111)
Creatinine, Ser: 1.15 mg/dL — ABNORMAL HIGH (ref 0.44–1.00)
GFR, Estimated: 57 mL/min — ABNORMAL LOW (ref >60)
Glucose, Bld: 119 mg/dL — ABNORMAL HIGH (ref 70–99)
Potassium: 4 mmol/L (ref 3.5–5.1)
Sodium: 138 mmol/L (ref 135–145)

## 2022-06-01 MED ORDER — MECLIZINE HCL 25 MG PO TABS
25.0000 mg | ORAL_TABLET | Freq: Once | ORAL | Status: DC
  Filled 2022-06-01: qty 1

## 2022-06-01 MED ORDER — ONDANSETRON 4 MG PO TBDP: 4.0000 mg | ORAL_TABLET | Freq: Three times a day (TID) | ORAL | 0 refills | Status: DC | PRN

## 2022-06-01 MED ORDER — SODIUM CHLORIDE 0.9 % IV BOLUS
1000.0000 mL | Freq: Once | INTRAVENOUS | Status: AC
  Administered 2022-06-01: 1000 mL via INTRAVENOUS

## 2022-06-01 MED ORDER — DIAZEPAM 5 MG PO TABS: 5.0000 mg | ORAL_TABLET | Freq: Two times a day (BID) | ORAL | 0 refills | Status: AC | PRN

## 2022-06-01 MED ORDER — DIAZEPAM 5 MG/ML IJ SOLN
2.5000 mg | Freq: Once | INTRAMUSCULAR | Status: AC
  Administered 2022-06-01: 2.5 mg via INTRAVENOUS
  Filled 2022-06-01: qty 2

## 2022-06-01 NOTE — ED Notes (Signed)
Ambulated pt in hallway, pt had steady gait. Pt only complained of slight ringing in ears

## 2022-06-01 NOTE — ED Triage Notes (Signed)
Pt arrived from home by [facility]. Pt c/o nausea and vomiting. Pt has hx of vertigo and compliant with meds. 4mg  zofran given by EMS.

## 2022-06-01 NOTE — ED Provider Notes (Signed)
[place] EMERGENCY DEPARTMENT AT [facility] Provider Note   CSN: [id number] Arrival date & time: 06/01/22  4098     History  Chief Complaint  Patient presents with   Nausea    Efrat Jenson is a 52 y.o. female.  The history is provided by the patient and medical records.   52 year old female with history of hypertension, diabetes, anemia, vertigo, presenting to the ED with dizziness, nausea, and vomiting.  She states it feels like her vertigo again.  This began after she and boyfriend ate dinner and were going to bed.  She endorses room spinning sensation, worse when trying to move or turning her head, better if lying still with eyes closed.  She did have some nausea and vomiting.  She attempted to take her meclizine but thinks she vomited this up.  She denies any new/focal numbness or weakness.  She is not having any blurred vision, changes in speech, etc.  She did receive Zofran with EMS which has helped her nausea.  No chest pain or shortness of breath.  No abdominal pain.  Home Medications Prior to Admission medications   Medication Sig Start Date End Date Taking? Authorizing Provider  allopurinol (ZYLOPRIM) 100 MG tablet Take 200 mg by mouth daily. 08/10/19   [provider]  atorvastatin (LIPITOR) 20 MG tablet SMARTSIG:1 Tablet(s) By Mouth Every Evening 08/10/19   [provider]  benzonatate (TESSALON) 100 MG capsule Take 1 capsule (100 mg total) by mouth every 8 (eight) hours. 12/28/18   Ronnie Doss A, PA-C  escitalopram (LEXAPRO) 10 MG tablet Take 10 mg by mouth daily.    [provider]  HYDRALAZINE-HCTZ PO Take by mouth.    [provider]  hydrochlorothiazide (HYDRODIURIL) 25 MG tablet Take 25 mg by mouth daily.    [provider]  HYDROcodone-acetaminophen (NORCO/VICODIN) 5-325 MG tablet Take 1 tablet by mouth every 6 (six) hours as needed. 08/02/19   [provider]  hydrocortisone (ANUSOL-HC) 25 MG  suppository Place 1 suppository (25 mg total) rectally 2 (two) times daily. 04/14/22   Lyndle Herrlich, MD  ibuprofen (ADVIL,MOTRIN) 600 MG tablet Take 1 tablet (600 mg total) by mouth 3 (three) times daily. 09/27/16   Hedges, Tinnie Gens, PA-C  IRON PO Take by mouth.    [provider]  losartan (COZAAR) 100 MG tablet Take 100 mg by mouth daily. 08/10/19   [provider]  meclizine (ANTIVERT) 25 MG tablet Take 1 tablet (25 mg total) by mouth 3 (three) times daily as needed for dizziness. 08/30/19   Long, Arlyss Repress, MD  METFORMIN HCL PO Take by mouth.    [provider]  ondansetron (ZOFRAN) 4 MG tablet Take 1 tablet (4 mg total) by mouth every 6 (six) hours. 12/28/18   Ronnie Doss A, PA-C  senna-docusate (SENOKOT-S) 8.6-50 MG tablet Take 1 tablet by mouth daily. 04/14/22   Lyndle Herrlich, MD      Allergies    Patient has no known allergies.    Review of Systems   Review of Systems  Gastrointestinal:  Positive for nausea and vomiting.  Neurological:  Positive for dizziness.  All other systems reviewed and are negative.   Physical Exam Updated Vital Signs BP 137/89   Pulse 67   Temp (!) 97.5 F (36.4 C)   Resp 14   Ht 5\' 6"  (1.676 m)   Wt 111.5 kg   LMP 11/28/2015   SpO2 100%   BMI 39.68 kg/m  Physical Exam  Vitals and nursing note reviewed.  Constitutional:      Appearance: She is well-developed.     Comments: Sitting very still with eyes closed  HENT:     Head: Normocephalic and atraumatic.  Eyes:     Conjunctiva/sclera: Conjunctivae normal.     Pupils: Pupils are equal, round, and reactive to light.     Comments: EOMs intact, horizontal nystagmus noted, EOM testing does induce dizziness  Cardiovascular:     Rate and Rhythm: Normal rate and regular rhythm.     Heart sounds: Normal heart sounds.  Pulmonary:     Effort: Pulmonary effort is normal.     Breath sounds: Normal breath sounds.  Abdominal:     General: Bowel sounds are  normal.     Palpations: Abdomen is soft.  Musculoskeletal:        General: Normal range of motion.     Cervical back: Normal range of motion.  Skin:    General: Skin is warm and dry.  Neurological:     Mental Status: She is alert and oriented to person, place, and time.     Comments: AAOx3, answering questions and following commands appropriately; equal strength UE and LE bilaterally; CN grossly intact; moves all extremities appropriately without ataxia; no focal neuro deficits or facial asymmetry appreciated     ED Results / Procedures / Treatments   Labs (all labs ordered are listed, but only abnormal results are displayed) Labs Reviewed  CBC WITH DIFFERENTIAL/PLATELET - Abnormal; Notable for the following components:      Result Value   RBC 5.64 (*)    HCT 46.6 (*)    All other components within normal limits  BASIC METABOLIC PANEL - Abnormal; Notable for the following components:   Glucose, Bld 119 (*)    Creatinine, Ser 1.15 (*)    GFR, Estimated 57 (*)    All other components within normal limits    EKG None  Radiology No results found.  Procedures Procedures    Medications Ordered in ED Medications  meclizine (ANTIVERT) tablet 25 mg (25 mg Oral Patient Refused/Not Given 06/01/22 0238)  sodium chloride 0.9 % bolus 1,000 mL (1,000 mLs Intravenous New Bag/Given 06/01/22 0239)  diazepam (VALIUM) injection 2.5 mg (2.5 mg Intravenous Given 06/01/22 0238)    ED Course/ Medical Decision Making/ A&P                             Medical Decision Making Amount and/or Complexity of Data Reviewed Labs: ordered. ECG/medicine tests: ordered and independent interpretation performed.  Risk Prescription drug management.   52 year old female presenting to the ED with dizziness.  She has known history of vertigo and reports it feels the same.  She reports a room spinning sensation with nausea and vomiting, worse with movement or turning head, better sitting still with eyes  closed.  On my exam she is lying perfectly still, eyes closed and not moving.  Her EOMs are intact and she does have a notable horizontal nystagmus.  She has no focal neurologic deficits.  Given her physical exam findings I suspect this is likely her peripheral vertigo again.  I have low suspicion for acute CVA or central cause of her dizziness, do not feel she needs emergent head CT at this time.  She is agreeable to trial of IV fluids, Valium, and repeat dose of meclizine as she vomited this up at home.  4:32 AM Patient is feeling  better here after IV fluids and Valium (refused meclizine after initially agreeing to it). States she still feels a tiny bit dizzy but significantly improved from prior.  Will try to ambulate here to ensure she is steady on her feet.  Labs are overall reassuring without leukocytosis or electrolyte derangement.  Patient was able to ambulate in the hall with steady gait.  Seems to have better response to Valium that she does to meclizine, will give her prescription for home if needed for recurrent symptoms.  She was strongly encouraged to follow-up with her primary care doctor.  She can return here for any new or acute changes.  Final Clinical Impression(s) / ED Diagnoses Final diagnoses:  Dizziness    Rx / DC Orders ED Discharge Orders          Ordered    diazepam (VALIUM) 5 MG tablet  Every 12 hours PRN        06/01/22 0555    ondansetron (ZOFRAN-ODT) 4 MG disintegrating tablet  Every 8 hours PRN        06/01/22 0555              Garlon Hatchet, PA-C 06/01/22 0610    Nira Conn, MD 06/01/22 [phone]

## 2022-06-01 NOTE — ED Notes (Signed)
Attempted IV x2; unsuccessful attempt.

## 2022-06-01 NOTE — Discharge Instructions (Signed)
You were seen today for dizziness.  This seems consistent with your known vertigo.  I would continue to try your meclizine, if you are not getting relief, can try the valium since that worked well for you in the ER today. Follow-up with your primary care doctor. Return to the ED for new or worsening symptoms.

## 2022-08-11 ENCOUNTER — Emergency Department (HOSPITAL_COMMUNITY): Payer: 59

## 2022-08-11 ENCOUNTER — Other Ambulatory Visit: Payer: Self-pay

## 2022-08-11 ENCOUNTER — Emergency Department (HOSPITAL_COMMUNITY)
Admission: EM | Admit: 2022-08-11 | Discharge: 2022-08-11 | Disposition: A | Discharge status: Home / Self Care | Payer: 59 | Attending: Emergency Medicine | Admitting: Emergency Medicine

## 2022-08-11 DIAGNOSIS — G8929 Other chronic pain: Secondary | ICD-10-CM

## 2022-08-11 DIAGNOSIS — Z79899 Other long term (current) drug therapy: Secondary | ICD-10-CM | POA: Insufficient documentation

## 2022-08-11 DIAGNOSIS — Z7984 Long term (current) use of oral hypoglycemic drugs: Secondary | ICD-10-CM | POA: Diagnosis not present

## 2022-08-11 DIAGNOSIS — E119 Type 2 diabetes mellitus without complications: Secondary | ICD-10-CM | POA: Diagnosis not present

## 2022-08-11 DIAGNOSIS — I10 Essential (primary) hypertension: Secondary | ICD-10-CM | POA: Insufficient documentation

## 2022-08-11 DIAGNOSIS — M545 Low back pain, unspecified: Secondary | ICD-10-CM | POA: Diagnosis not present

## 2022-08-11 DIAGNOSIS — M549 Dorsalgia, unspecified: Secondary | ICD-10-CM | POA: Diagnosis present

## 2022-08-11 MED ORDER — ACETAMINOPHEN 500 MG PO TABS
1000.0000 mg | ORAL_TABLET | Freq: Once | ORAL | Status: AC
  Administered 2022-08-11: 1000 mg via ORAL
  Filled 2022-08-11: qty 2

## 2022-08-11 MED ORDER — DIAZEPAM 2 MG PO TABS
2.0000 mg | ORAL_TABLET | Freq: Once | ORAL | Status: AC
  Administered 2022-08-11: 2 mg via ORAL
  Filled 2022-08-11: qty 1

## 2022-08-11 MED ORDER — OXYCODONE HCL 5 MG PO TABS
5.0000 mg | ORAL_TABLET | Freq: Once | ORAL | Status: AC
  Administered 2022-08-11: 5 mg via ORAL
  Filled 2022-08-11: qty 1

## 2022-08-11 MED ORDER — KETOROLAC TROMETHAMINE 15 MG/ML IJ SOLN
15.0000 mg | Freq: Once | INTRAMUSCULAR | Status: AC
  Administered 2022-08-11: 15 mg via INTRAMUSCULAR
  Filled 2022-08-11: qty 1

## 2022-08-11 MED ORDER — CYCLOBENZAPRINE HCL 10 MG PO TABS: 10.0000 mg | ORAL_TABLET | Freq: Three times a day (TID) | ORAL | 0 refills | Status: AC

## 2022-08-11 MED ORDER — LIDOCAINE 5 % EX PTCH
1.0000 | MEDICATED_PATCH | CUTANEOUS | Status: DC
  Administered 2022-08-11: 1 via TRANSDERMAL
  Filled 2022-08-11: qty 1

## 2022-08-11 NOTE — Discharge Instructions (Addendum)
I prescribed a short course of muscle risers to help with your pain, please take 1 tablet 3 times a day for the next 7 days.  Please be aware this medication can make you drowsy.  If you have any worsening symptoms to return to the emergency department.

## 2022-08-11 NOTE — ED Provider Notes (Signed)
[place] EMERGENCY DEPARTMENT AT [facility] Provider Note   CSN: [id number] Arrival date & time: 08/11/22  1115     History HTN, Anemia Chief Complaint  Patient presents with   Back Pain    [name] [name] is a 52 y.o. female.  52 y.o female with a PMH DM, HTN, Anemia presents to the ED via POV with a chief complaint of back pain which began yesterday.  Patient reports right-sided tingling, and was supposed to have a spine stimulator placed yesterday but she missed the appointment due to her ride.  Reports has been taking her hydrocodone, gabapentin along with heating pad without any improvement in symptoms.  She reports is worse with any type of ambulation along with movement.  She denies any current history of diabetes, this is diet control.  Denies any fever, IV drug use, bowel or bladder complaints, cancer.  The history is provided by the patient.  Back Pain Associated symptoms: no fever        Home Medications Prior to Admission medications   Medication Sig Start Date End Date Taking? Authorizing Provider  cyclobenzaprine (FLEXERIL) 10 MG tablet Take 1 tablet (10 mg total) by mouth 3 (three) times daily for 7 days. 08/11/22 08/18/22 Yes Kaylaann Mountz, Leonie Douglas, PA-C  allopurinol (ZYLOPRIM) 100 MG tablet Take 200 mg by mouth daily. 08/10/19   [provider]  atorvastatin (LIPITOR) 20 MG tablet SMARTSIG:1 Tablet(s) By Mouth Every Evening 08/10/19   [provider]  benzonatate (TESSALON) 100 MG capsule Take 1 capsule (100 mg total) by mouth every 8 (eight) hours. 12/28/18   Ronnie Doss A, PA-C  diazepam (VALIUM) 5 MG tablet Take 1 tablet (5 mg total) by mouth every 12 (twelve) hours as needed (dizziness). 06/01/22   Garlon Hatchet, PA-C  escitalopram (LEXAPRO) 10 MG tablet Take 10 mg by mouth daily.    [provider]  HYDRALAZINE-HCTZ PO Take by mouth.    [provider]  hydrochlorothiazide (HYDRODIURIL) 25 MG tablet Take 25 mg by mouth  daily.    [provider]  HYDROcodone-acetaminophen (NORCO/VICODIN) 5-325 MG tablet Take 1 tablet by mouth every 6 (six) hours as needed. 08/02/19   [provider]  hydrocortisone (ANUSOL-HC) 25 MG suppository Place 1 suppository (25 mg total) rectally 2 (two) times daily. 04/14/22   Lyndle Herrlich, MD  ibuprofen (ADVIL,MOTRIN) 600 MG tablet Take 1 tablet (600 mg total) by mouth 3 (three) times daily. 09/27/16   Hedges, Tinnie Gens, PA-C  IRON PO Take by mouth.    [provider]  losartan (COZAAR) 100 MG tablet Take 100 mg by mouth daily. 08/10/19   [provider]  meclizine (ANTIVERT) 25 MG tablet Take 1 tablet (25 mg total) by mouth 3 (three) times daily as needed for dizziness. 08/30/19   Long, Arlyss Repress, MD  METFORMIN HCL PO Take by mouth.    [provider]  ondansetron (ZOFRAN) 4 MG tablet Take 1 tablet (4 mg total) by mouth every 6 (six) hours. 12/28/18   Ronnie Doss A, PA-C  ondansetron (ZOFRAN-ODT) 4 MG disintegrating tablet Take 1 tablet (4 mg total) by mouth every 8 (eight) hours as needed for nausea. 06/01/22   Garlon Hatchet, PA-C  senna-docusate (SENOKOT-S) 8.6-50 MG tablet Take 1 tablet by mouth daily. 04/14/22   Lyndle Herrlich, MD      Allergies    Patient has no known allergies.    Review of Systems   Review of Systems  Constitutional:  Negative  for fever.  Musculoskeletal:  Positive for back pain.    Physical Exam Updated Vital Signs BP (!) 139/92 (BP Location: Left Arm)   Pulse 74   Temp 98.4 F (36.9 C) (Oral)   Resp 18   Ht 5\' 6"  (1.676 m)   Wt 111.5 kg   LMP 11/28/2015   SpO2 100%   BMI 39.68 kg/m  Physical Exam Vitals and nursing note reviewed.  Constitutional:      Appearance: Normal appearance.  HENT:     Head: Normocephalic and atraumatic.  Eyes:     Pupils: Pupils are equal, round, and reactive to light.  Cardiovascular:     Rate and Rhythm: Normal rate.  Pulmonary:     Effort: Pulmonary  effort is normal.  Abdominal:     General: Abdomen is flat.     Palpations: Abdomen is soft.  Musculoskeletal:     Cervical back: Normal range of motion and neck supple.     Lumbar back: Tenderness present.     Comments: RLE- KF,KE 5/5 strength LLE- HF, HE 5/5 strength Normal gait. No pronator drift. No leg drop. . CN I, II and VIII not tested. CN II-XII grossly intact bilaterally.      Skin:    General: Skin is warm and dry.  Neurological:     Mental Status: She is alert and oriented to person, place, and time.     ED Results / Procedures / Treatments   Labs (all labs ordered are listed, but only abnormal results are displayed) Labs Reviewed - No data to display  EKG None  Radiology DG Lumbar Spine Complete  Result Date: 08/11/2022 CLINICAL DATA:  Low back pain beginning yesterday. EXAM: LUMBAR SPINE - COMPLETE 4+ VIEW COMPARISON:  06/12/2013 radiography.  MRI report 11/06/2021. FINDINGS: Lowest 5 vertebrae are labeled L1 through L5. This is consistent with prior exams. No abnormality seen at L3-4 and above other than mild facet osteoarthritis at L3-4 but no slippage. At L4-5, there is mild disc space narrowing. There is advanced bilateral facet arthropathy with anterolisthesis of 5 mm. This could worsen with standing flexion. L5-S1 shows chronic disc degeneration with loss of disc height. Endplate sclerotic changes and small endplate osteophytes. Bilateral facet osteoarthritis without slippage. IMPRESSION: 1. No acute or traumatic finding. 2. Degenerative disc disease and degenerative facet disease at L4-5 and L5-S1. Anterolisthesis of 5 mm at L4-5 that could worsen with standing flexion. Lesser facet osteoarthritis at L3-4. 3. Chronic disc degeneration at L5-S1 that could be associated with low back pain. Electronically Signed   By: Paulina Fusi M.D.   On: 08/11/2022 12:42    Procedures Procedures    Medications Ordered in ED Medications  diazepam (VALIUM) tablet 2 mg (has  no administration in time range)  lidocaine (LIDODERM) 5 % 1 patch (1 patch Transdermal Patch Applied 08/11/22 1316)  oxyCODONE (Oxy IR/ROXICODONE) immediate release tablet 5 mg (5 mg Oral Given 08/11/22 1316)  acetaminophen (TYLENOL) tablet 1,000 mg (1,000 mg Oral Given 08/11/22 1316)  ketorolac (TORADOL) 15 MG/ML injection 15 mg (15 mg Intramuscular Given 08/11/22 1316)    ED Course/ Medical Decision Making/ A&P                                 Medical Decision Making Amount and/or Complexity of Data Reviewed Radiology: ordered.  Risk OTC drugs. Prescription drug management.    Patient here with complaints of acute on  chronic back pain which began yesterday, was able to have a spine stimulator placed however missed this appointment.  Endorsing severe pain to the right side with no radiation down her leg.  Has tried taking her home therapy such as hydrocodone and gabapentin without any improvement in symptoms.  Did ambulate in the emergency department with assisted cane.  Neurological exam is unremarkable without any weakness to bilateral lower extremities.  Denies any red flag such as fever, cancer, IV drug use, bowel or bladder complaints.  Is requesting x-ray although there was no trauma since this pain exacerbated.  Given medication for pain control while in the ED as she calls a ride to be picked up.   Xray of her lumbar spine showed: IMPRESSION:  1. No acute or traumatic finding.  2. Degenerative disc disease and degenerative facet disease at L4-5  and L5-S1. Anterolisthesis of 5 mm at L4-5 that could worsen with  standing flexion. Lesser facet osteoarthritis at L3-4.  3. Chronic disc degeneration at L5-S1 that could be associated with  low back pain.   These results were discussed with patient, she reports most of her x-rays have not worked in the past, however she is under a pain contract and currently receiving hydrocodone along with gabapentin for pain control.  I discussed with  her another trial of muscle relaxers at this time.  Seeing as this is acute on chronic pain.  Will reschedule her spine stimulator for a subsequent month. She is stable for discharge.    Portions of this note were generated with Scientist, clinical (histocompatibility and immunogenetics). Dictation errors may occur despite best attempts at proofreading.   Final Clinical Impression(s) / ED Diagnoses Final diagnoses:  Chronic right-sided low back pain without sciatica    Rx / DC Orders ED Discharge Orders          Ordered    cyclobenzaprine (FLEXERIL) 10 MG tablet  3 times daily        08/11/22 1325              Claude Manges, PA-C 08/11/22 1325    Gwyneth Sprout, MD 08/11/22 1504

## 2022-08-11 NOTE — ED Triage Notes (Signed)
Pt reports lower back pain since yesterday. Denies trauma to area

## 2022-09-13 ENCOUNTER — Encounter (HOSPITAL_COMMUNITY): Payer: Self-pay

## 2022-09-13 ENCOUNTER — Emergency Department (HOSPITAL_COMMUNITY)
Admission: EM | Admit: 2022-09-13 | Discharge: 2022-09-13 | Disposition: A | Discharge status: Home / Self Care | Payer: 59 | Attending: Emergency Medicine | Admitting: Emergency Medicine

## 2022-09-13 ENCOUNTER — Other Ambulatory Visit: Payer: Self-pay

## 2022-09-13 DIAGNOSIS — M5116 Intervertebral disc disorders with radiculopathy, lumbar region: Secondary | ICD-10-CM | POA: Diagnosis not present

## 2022-09-13 DIAGNOSIS — I10 Essential (primary) hypertension: Secondary | ICD-10-CM | POA: Diagnosis not present

## 2022-09-13 DIAGNOSIS — Z7984 Long term (current) use of oral hypoglycemic drugs: Secondary | ICD-10-CM | POA: Insufficient documentation

## 2022-09-13 DIAGNOSIS — E119 Type 2 diabetes mellitus without complications: Secondary | ICD-10-CM | POA: Insufficient documentation

## 2022-09-13 DIAGNOSIS — Z79899 Other long term (current) drug therapy: Secondary | ICD-10-CM | POA: Insufficient documentation

## 2022-09-13 DIAGNOSIS — M5136 Other intervertebral disc degeneration, lumbar region: Secondary | ICD-10-CM

## 2022-09-13 DIAGNOSIS — M545 Low back pain, unspecified: Secondary | ICD-10-CM | POA: Diagnosis present

## 2022-09-13 DIAGNOSIS — M5416 Radiculopathy, lumbar region: Secondary | ICD-10-CM

## 2022-09-13 MED ORDER — ONDANSETRON 4 MG PO TBDP
4.0000 mg | ORAL_TABLET | Freq: Once | ORAL | Status: AC
  Administered 2022-09-13: 4 mg via ORAL
  Filled 2022-09-13: qty 1

## 2022-09-13 MED ORDER — KETOROLAC TROMETHAMINE 60 MG/2ML IM SOLN
30.0000 mg | Freq: Once | INTRAMUSCULAR | Status: AC
  Administered 2022-09-13: 30 mg via INTRAMUSCULAR
  Filled 2022-09-13: qty 2

## 2022-09-13 MED ORDER — HYDROMORPHONE HCL 1 MG/ML IJ SOLN
2.0000 mg | Freq: Once | INTRAMUSCULAR | Status: AC
  Administered 2022-09-13: 2 mg via INTRAMUSCULAR
  Filled 2022-09-13: qty 2

## 2022-09-13 NOTE — Discharge Instructions (Signed)
Continue your current medications.

## 2022-09-13 NOTE — ED Provider Notes (Signed)
[place] EMERGENCY DEPARTMENT AT [facility] Provider Note   CSN: [id number] Arrival date & time: 09/13/22  1323     History  Chief Complaint  Patient presents with   Back Pain    [name] [name] is a 52 y.o. female.  Patient is a 52 year old female with a history of hypertension, diabetes, chronic low back pain status post 2 lumbar surgeries who was supposed to get a stimulator in July but changed her mind presenting today with complaints of back pain.  Patient reports for the last 3 weeks at least she has had significant pain in her lower back that radiates into her right hip and down her leg.  Any type of twisting bending movement makes the pain worse.  Occasionally her leg feels weak as well.  She denies any falls or trauma.  She denies significant numbness or tingling.  No bladder retention or bowel incontinence.  She has not had a fever.  She is taking her hydrocodone and Flexeril at home as well as using extra strength Tylenol and a heating pad with minimal relief.  She is planning on calling back the specialist who was going to place the stimulator to let them know she has changed her mind and willing to proceed with the procedure but for today she was just requesting some pain relief.  The history is provided by the patient and medical records.  Back Pain      Home Medications Prior to Admission medications   Medication Sig Start Date End Date Taking? Authorizing Provider  allopurinol (ZYLOPRIM) 100 MG tablet Take 200 mg by mouth daily. 08/10/19   [provider]  atorvastatin (LIPITOR) 20 MG tablet SMARTSIG:1 Tablet(s) By Mouth Every Evening 08/10/19   [provider]  benzonatate (TESSALON) 100 MG capsule Take 1 capsule (100 mg total) by mouth every 8 (eight) hours. 12/28/18   Ronnie Doss A, PA-C  diazepam (VALIUM) 5 MG tablet Take 1 tablet (5 mg total) by mouth every 12 (twelve) hours as needed (dizziness). 06/01/22   Garlon Hatchet, PA-C   escitalopram (LEXAPRO) 10 MG tablet Take 10 mg by mouth daily.    [provider]  HYDRALAZINE-HCTZ PO Take by mouth.    [provider]  hydrochlorothiazide (HYDRODIURIL) 25 MG tablet Take 25 mg by mouth daily.    [provider]  HYDROcodone-acetaminophen (NORCO/VICODIN) 5-325 MG tablet Take 1 tablet by mouth every 6 (six) hours as needed. 08/02/19   [provider]  hydrocortisone (ANUSOL-HC) 25 MG suppository Place 1 suppository (25 mg total) rectally 2 (two) times daily. 04/14/22   Lyndle Herrlich, MD  ibuprofen (ADVIL,MOTRIN) 600 MG tablet Take 1 tablet (600 mg total) by mouth 3 (three) times daily. 09/27/16   Hedges, Tinnie Gens, PA-C  IRON PO Take by mouth.    [provider]  losartan (COZAAR) 100 MG tablet Take 100 mg by mouth daily. 08/10/19   [provider]  meclizine (ANTIVERT) 25 MG tablet Take 1 tablet (25 mg total) by mouth 3 (three) times daily as needed for dizziness. 08/30/19   Long, Arlyss Repress, MD  METFORMIN HCL PO Take by mouth.    [provider]  ondansetron (ZOFRAN) 4 MG tablet Take 1 tablet (4 mg total) by mouth every 6 (six) hours. 12/28/18   Ronnie Doss A, PA-C  ondansetron (ZOFRAN-ODT) 4 MG disintegrating tablet Take 1 tablet (4 mg total) by mouth every 8 (eight) hours as needed for nausea. 06/01/22   Garlon Hatchet,  PA-C  senna-docusate (SENOKOT-S) 8.6-50 MG tablet Take 1 tablet by mouth daily. 04/14/22   Lyndle Herrlich, MD      Allergies    Patient has no known allergies.    Review of Systems   Review of Systems  Musculoskeletal:  Positive for back pain.    Physical Exam Updated Vital Signs BP (!) 126/93 (BP Location: Right Arm)   Pulse 100   Temp 97.8 F (36.6 C) (Oral)   Resp 16   Ht 5\' 6"  (1.676 m)   Wt 106.6 kg   LMP 11/28/2015   SpO2 100%   BMI 37.93 kg/m  Physical Exam Vitals and nursing note reviewed.  Constitutional:      General: She is not in acute distress.     Appearance: She is well-developed.  HENT:     Head: Normocephalic and atraumatic.  Eyes:     Pupils: Pupils are equal, round, and reactive to light.  Cardiovascular:     Rate and Rhythm: Normal rate and regular rhythm.     Pulses: Normal pulses.     Heart sounds: Normal heart sounds. No murmur heard.    No friction rub.  Pulmonary:     Effort: Pulmonary effort is normal.     Breath sounds: Normal breath sounds. No wheezing or rales.  Abdominal:     General: Bowel sounds are normal. There is no distension.     Palpations: Abdomen is soft.     Tenderness: There is no abdominal tenderness. There is no guarding or rebound.  Musculoskeletal:        General: No tenderness. Normal range of motion.     Right lower leg: No edema.     Left lower leg: No edema.     Comments: No edema  Skin:    General: Skin is warm and dry.     Findings: No rash.  Neurological:     Mental Status: She is alert and oriented to person, place, and time.     Cranial Nerves: No cranial nerve deficit.     Sensory: No sensory deficit.     Motor: No weakness.     Comments: Patient is able to stand and walk but does use a cane.  She is able to get in the bed take her pants off partially but does have difficulty bending her leg up to her knee to take off her shoes and the bottom of her pant leg.  Pain with lumbar flexion.  Point tenderness in the SI and sciatic notch  Psychiatric:        Behavior: Behavior normal.     ED Results / Procedures / Treatments   Labs (all labs ordered are listed, but only abnormal results are displayed) Labs Reviewed - No data to display  EKG None  Radiology No results found.  Procedures Procedures    Medications Ordered in ED Medications  ketorolac (TORADOL) injection 30 mg (30 mg Intramuscular Given 09/13/22 1529)  HYDROmorphone (DILAUDID) injection 2 mg (2 mg Intramuscular Given 09/13/22 1530)    ED Course/ Medical Decision Making/ A&P                                  Medical Decision Making Risk Prescription drug management.   Pt with gradual onset of back pain suggestive of radiculopathy.  No neurovascular compromise and no incontinence.  Pt has no infectious sx, hx of CA  or other  red flags concerning for pathologic back pain.  Pt is able to ambulate but is painful.  Normal strength and reflexes on exam.  Denies trauma. Will give pt pain control and to return for developement of above sx.  Pt to f/u with her specialist  Patient's pain improved after IM meds and stable for discharge home.       Final Clinical Impression(s) / ED Diagnoses Final diagnoses:  Lumbar radiculopathy, chronic  Degenerative disc disease, lumbar    Rx / DC Orders ED Discharge Orders     None         Gwyneth Sprout, MD 09/13/22 1627

## 2022-09-13 NOTE — ED Notes (Signed)
PT stated that she continues to feel the effects of the pain medication and she does not feel safe leaving yet. PT is lying in the bed resting.

## 2022-09-13 NOTE — ED Triage Notes (Signed)
Pt arrived POV from home c/o lower back pain that runs down through her right leg. Pt states this has been ongoing for several weeks and sometimes it hurts to pick the right leg up.

## 2023-03-29 ENCOUNTER — Other Ambulatory Visit: Payer: Self-pay

## 2023-03-29 ENCOUNTER — Emergency Department (HOSPITAL_BASED_OUTPATIENT_CLINIC_OR_DEPARTMENT_OTHER)

## 2023-03-29 ENCOUNTER — Encounter (HOSPITAL_BASED_OUTPATIENT_CLINIC_OR_DEPARTMENT_OTHER): Payer: Self-pay

## 2023-03-29 ENCOUNTER — Emergency Department (HOSPITAL_BASED_OUTPATIENT_CLINIC_OR_DEPARTMENT_OTHER)
Admission: EM | Admit: 2023-03-29 | Discharge: 2023-03-29 | Disposition: A | Discharge status: Home / Self Care | Attending: Emergency Medicine | Admitting: Emergency Medicine

## 2023-03-29 DIAGNOSIS — R197 Diarrhea, unspecified: Secondary | ICD-10-CM | POA: Insufficient documentation

## 2023-03-29 DIAGNOSIS — R059 Cough, unspecified: Secondary | ICD-10-CM | POA: Insufficient documentation

## 2023-03-29 DIAGNOSIS — R112 Nausea with vomiting, unspecified: Secondary | ICD-10-CM | POA: Diagnosis present

## 2023-03-29 LAB — RESP PANEL BY RT-PCR (RSV, FLU A&B, COVID)  RVPGX2
Influenza A by PCR: NEGATIVE
Influenza B by PCR: NEGATIVE
Resp Syncytial Virus by PCR: NEGATIVE
SARS Coronavirus 2 by RT PCR: NEGATIVE

## 2023-03-29 MED ORDER — LOPERAMIDE HCL 2 MG PO CAPS
4.0000 mg | ORAL_CAPSULE | Freq: Once | ORAL | Status: AC
  Administered 2023-03-29: 4 mg via ORAL
  Filled 2023-03-29: qty 2

## 2023-03-29 MED ORDER — ONDANSETRON 4 MG PO TBDP: ORAL_TABLET | ORAL | 0 refills | Status: AC

## 2023-03-29 MED ORDER — ONDANSETRON 4 MG PO TBDP
4.0000 mg | ORAL_TABLET | Freq: Once | ORAL | Status: AC
  Administered 2023-03-29: 4 mg via ORAL
  Filled 2023-03-29: qty 1

## 2023-03-29 NOTE — Discharge Instructions (Addendum)
 Your chest x-ray looks okay.  I think you likely have a virus.  I prescribed you some nausea medicine for nausea.  You can take Imodium for diarrhea.  Please follow-up with your doctor in the office.  Try pepcid or tagamet up to twice a day.  Try to avoid things that may make this worse, most commonly these are spicy foods tomato based products fatty foods chocolate and peppermint.  Alcohol and tobacco can also make this worse.  Return to the emergency department for sudden worsening pain fever or inability to eat or drink.

## 2023-03-29 NOTE — ED Triage Notes (Signed)
 C/o nausea, vomiting on Thursday & last night, diarrhea. Cough x 5 days. Chest congestion. Tolerating water. States when she eats food she starts coughing.

## 2023-03-29 NOTE — ED Provider Notes (Signed)
 [place] EMERGENCY DEPARTMENT AT [facility] Provider Note   CSN: [id number] Arrival date & time: 03/29/23  4403     History  Chief Complaint  Patient presents with   Cough   Vomiting    [name] [name] is a 53 y.o. female.  53 yo F with a chief complaints of nausea vomiting and diarrhea.  She is also been coughing a little bit when she tries to eat or drink anything.  She denies being around anyone sick.  Denies abdominal pain.  Denies fevers.   Cough      Home Medications Prior to Admission medications   Medication Sig Start Date End Date Taking? Authorizing Provider  ondansetron (ZOFRAN-ODT) 4 MG disintegrating tablet 4mg  ODT q4 hours prn nausea/vomit 03/29/23  Yes Melene Plan, DO  allopurinol (ZYLOPRIM) 100 MG tablet Take 200 mg by mouth daily. 08/10/19   [provider]  atorvastatin (LIPITOR) 20 MG tablet SMARTSIG:1 Tablet(s) By Mouth Every Evening 08/10/19   [provider]  benzonatate (TESSALON) 100 MG capsule Take 1 capsule (100 mg total) by mouth every 8 (eight) hours. 12/28/18   Ronnie Doss A, PA-C  diazepam (VALIUM) 5 MG tablet Take 1 tablet (5 mg total) by mouth every 12 (twelve) hours as needed (dizziness). 06/01/22   Garlon Hatchet, PA-C  escitalopram (LEXAPRO) 10 MG tablet Take 10 mg by mouth daily.    [provider]  HYDRALAZINE-HCTZ PO Take by mouth.    [provider]  hydrochlorothiazide (HYDRODIURIL) 25 MG tablet Take 25 mg by mouth daily.    [provider]  HYDROcodone-acetaminophen (NORCO/VICODIN) 5-325 MG tablet Take 1 tablet by mouth every 6 (six) hours as needed. 08/02/19   [provider]  hydrocortisone (ANUSOL-HC) 25 MG suppository Place 1 suppository (25 mg total) rectally 2 (two) times daily. 04/14/22   Lyndle Herrlich, MD  ibuprofen (ADVIL,MOTRIN) 600 MG tablet Take 1 tablet (600 mg total) by mouth 3 (three) times daily. 09/27/16   Hedges, Tinnie Gens, PA-C  IRON PO Take by mouth.     [provider]  losartan (COZAAR) 100 MG tablet Take 100 mg by mouth daily. 08/10/19   [provider]  meclizine (ANTIVERT) 25 MG tablet Take 1 tablet (25 mg total) by mouth 3 (three) times daily as needed for dizziness. 08/30/19   Long, Arlyss Repress, MD  METFORMIN HCL PO Take by mouth.    [provider]  ondansetron (ZOFRAN) 4 MG tablet Take 1 tablet (4 mg total) by mouth every 6 (six) hours. 12/28/18   Ronnie Doss A, PA-C  senna-docusate (SENOKOT-S) 8.6-50 MG tablet Take 1 tablet by mouth daily. 04/14/22   Lyndle Herrlich, MD      Allergies    Patient has no known allergies.    Review of Systems   Review of Systems  Respiratory:  Positive for cough.     Physical Exam Updated Vital Signs BP (!) 135/102 (BP Location: Right Arm)   Pulse (!) 114   Temp 97.7 F (36.5 C)   Resp 16   Ht 5\' 6"  (1.676 m)   Wt 113.4 kg   LMP 11/28/2015   SpO2 99%   BMI 40.35 kg/m  Physical Exam Vitals and nursing note reviewed.  Constitutional:      General: She is not in acute distress.    Appearance: She is well-developed. She is not diaphoretic.  HENT:     Head: Normocephalic and atraumatic.  Eyes:     Pupils: Pupils are  equal, round, and reactive to light.  Cardiovascular:     Rate and Rhythm: Normal rate and regular rhythm.     Heart sounds: No murmur heard.    No friction rub. No gallop.  Pulmonary:     Effort: Pulmonary effort is normal.     Breath sounds: No wheezing or rales.  Abdominal:     General: There is no distension.     Palpations: Abdomen is soft.     Tenderness: There is no abdominal tenderness.  Musculoskeletal:        General: No tenderness.     Cervical back: Normal range of motion and neck supple.  Skin:    General: Skin is warm and dry.  Neurological:     Mental Status: She is alert and oriented to person, place, and time.  Psychiatric:        Behavior: Behavior normal.     ED Results / Procedures / Treatments   Labs (all  labs ordered are listed, but only abnormal results are displayed) Labs Reviewed  RESP PANEL BY RT-PCR (RSV, FLU A&B, COVID)  [id number]    EKG None  Radiology DG Chest 2 View Result Date: 03/29/2023 CLINICAL DATA:  Cough for 5 days. EXAM: CHEST - 2 VIEW COMPARISON:  12/25/2019. FINDINGS: Bilateral lung fields are clear. Bilateral costophrenic angles are clear. Normal cardio-mediastinal silhouette. No acute osseous abnormalities. The soft tissues are within normal limits. IMPRESSION: No active cardiopulmonary disease. Electronically Signed   By: Jules Schick M.D.   On: 03/29/2023 11:09    Procedures Procedures    Medications Ordered in ED Medications  ondansetron (ZOFRAN-ODT) disintegrating tablet 4 mg (4 mg Oral Given 03/29/23 1036)  loperamide (IMODIUM) capsule 4 mg (4 mg Oral Given 03/29/23 1036)    ED Course/ Medical Decision Making/ A&P                                 Medical Decision Making Amount and/or Complexity of Data Reviewed Radiology: ordered.  Risk Prescription drug management.   53 yo F with a chief complaint of nausea vomiting and diarrhea.  Going on for a couple days now.  Well-appearing and nontoxic.  Was tachycardic on arrival but on my manual count she is not tachycardic.  She appears well-hydrated.  Oral trial here.  Chest x-ray.  Suspect likely viral syndrome as there have been quite a few pieces recently with nausea vomiting and diarrhea.  She has a benign abdominal exam.  Patient able to tolerate by mouth here without issue.  Will discharge home.  PCP follow-up.  11:14 AM:  I have discussed the diagnosis/risks/treatment options with the patient.  Evaluation and diagnostic testing in the emergency department does not suggest an emergent condition requiring admission or immediate intervention beyond what has been performed at this time.  They will follow up with PCP. We also discussed returning to the ED immediately if new or worsening sx occur. We discussed  the sx which are most concerning (e.g., sudden worsening pain, fever, inability to tolerate by mouth) that necessitate immediate return. Medications administered to the patient during their visit and any new prescriptions provided to the patient are listed below.  Medications given during this visit Medications  ondansetron (ZOFRAN-ODT) disintegrating tablet 4 mg (4 mg Oral Given 03/29/23 1036)  loperamide (IMODIUM) capsule 4 mg (4 mg Oral Given 03/29/23 1036)     The patient appears reasonably screen and/or stabilized  for discharge and I doubt any other medical condition or other [facility] requiring further screening, evaluation, or treatment in the ED at this time prior to discharge.          Final Clinical Impression(s) / ED Diagnoses Final diagnoses:  Nausea vomiting and diarrhea    Rx / DC Orders ED Discharge Orders          Ordered    ondansetron (ZOFRAN-ODT) 4 MG disintegrating tablet        03/29/23 1113              Melene Plan, DO 03/29/23 1114

## 2023-09-05 ENCOUNTER — Emergency Department (HOSPITAL_COMMUNITY)

## 2023-09-05 ENCOUNTER — Encounter (HOSPITAL_COMMUNITY): Payer: Self-pay

## 2023-09-05 ENCOUNTER — Emergency Department (HOSPITAL_COMMUNITY)
Admission: EM | Admit: 2023-09-05 | Discharge: 2023-09-05 | Disposition: A | Discharge status: Home / Self Care | Attending: Emergency Medicine | Admitting: Emergency Medicine

## 2023-09-05 ENCOUNTER — Other Ambulatory Visit: Payer: Self-pay

## 2023-09-05 DIAGNOSIS — N3000 Acute cystitis without hematuria: Secondary | ICD-10-CM

## 2023-09-05 DIAGNOSIS — K5903 Drug induced constipation: Secondary | ICD-10-CM | POA: Insufficient documentation

## 2023-09-05 DIAGNOSIS — K5900 Constipation, unspecified: Secondary | ICD-10-CM | POA: Diagnosis present

## 2023-09-05 LAB — URINALYSIS, ROUTINE W REFLEX MICROSCOPIC
Bilirubin Urine: NEGATIVE
Glucose, UA: NEGATIVE mg/dL
Ketones, ur: NEGATIVE mg/dL
Nitrite: NEGATIVE
Protein, ur: 30 mg/dL — AB
RBC / HPF: >50 RBC/hpf (ref 0–5)
Specific Gravity, Urine: 1.011 (ref 1.005–1.030)
WBC, UA: >50 WBC/hpf (ref 0–5)
pH: 6 (ref 5.0–8.0)

## 2023-09-05 MED ORDER — CEPHALEXIN 500 MG PO CAPS: 500.0000 mg | ORAL_CAPSULE | Freq: Four times a day (QID) | ORAL | 0 refills | Status: AC

## 2023-09-05 MED ORDER — CEPHALEXIN 500 MG PO CAPS
500.0000 mg | ORAL_CAPSULE | Freq: Once | ORAL | Status: AC
  Administered 2023-09-05: 500 mg via ORAL
  Filled 2023-09-05: qty 1

## 2023-09-05 NOTE — ED Provider Notes (Signed)
 [place] EMERGENCY DEPARTMENT AT [facility] Provider Note   CSN: [id number] Arrival date & time: 09/05/23  1746     Patient presents with: Constipation   [name] [name] is a 53 y.o. female.   Patient complains of discomfort with urination.  Patient reports she is also constipated.  Patient reports she has been constipated for at least 2 days.  Patient reports she has the urge to go to the bathroom but when she tries to go she is unable to.  Patient reports that she is on hydrocodone  and this causes her to be constipated.  Patient reports taking 2 Senokot without relief.  Patient has chronic back pain and reports that trying to sit on the toilet and straining causes her increased pain.  Patient denies any fever or chills she is not having any abdominal pain.  Patient complains of some stinging when she urinates.  The history is provided by the patient. No language interpreter was used.  Constipation      Prior to Admission medications   Medication Sig Start Date End Date Taking? Authorizing Provider  allopurinol (ZYLOPRIM) 100 MG tablet Take 200 mg by mouth daily. 08/10/19   [provider]  atorvastatin (LIPITOR) 20 MG tablet SMARTSIG:1 Tablet(s) By Mouth Every Evening 08/10/19   [provider]  benzonatate  (TESSALON ) 100 MG capsule Take 1 capsule (100 mg total) by mouth every 8 (eight) hours. 12/28/18   Rolan Burnard LABOR, PA-C  diazepam  (VALIUM ) 5 MG tablet Take 1 tablet (5 mg total) by mouth every 12 (twelve) hours as needed (dizziness). 06/01/22   Jarold Olam HERO, PA-C  escitalopram (LEXAPRO) 10 MG tablet Take 10 mg by mouth daily.    [provider]  HYDRALAZINE-HCTZ PO Take by mouth.    [provider]  hydrochlorothiazide (HYDRODIURIL) 25 MG tablet Take 25 mg by mouth daily.    [provider]  HYDROcodone -acetaminophen  (NORCO/VICODIN) 5-325 MG tablet Take 1 tablet by mouth every 6 (six) hours as needed. 08/02/19   [provider]  hydrocortisone  (ANUSOL -HC) 25 MG suppository Place 1 suppository (25 mg total) rectally 2 (two) times daily. 04/14/22   Sridharan, Sriramkumar, MD  ibuprofen  (ADVIL ,MOTRIN ) 600 MG tablet Take 1 tablet (600 mg total) by mouth 3 (three) times daily. 09/27/16   Hedges, Reyes, PA-C  IRON PO Take by mouth.    [provider]  losartan (COZAAR) 100 MG tablet Take 100 mg by mouth daily. 08/10/19   [provider]  meclizine  (ANTIVERT ) 25 MG tablet Take 1 tablet (25 mg total) by mouth 3 (three) times daily as needed for dizziness. 08/30/19   Long, Joshua G, MD  METFORMIN HCL PO Take by mouth.    [provider]  ondansetron  (ZOFRAN ) 4 MG tablet Take 1 tablet (4 mg total) by mouth every 6 (six) hours. 12/28/18   Rolan Burnard LABOR, PA-C  ondansetron  (ZOFRAN -ODT) 4 MG disintegrating tablet 4mg  ODT q4 hours prn nausea/vomit 03/29/23   Floyd, Dan, DO  senna-docusate (SENOKOT-S) 8.6-50 MG tablet Take 1 tablet by mouth daily. 04/14/22   Sridharan, Sriramkumar, MD    Allergies: Patient has no known allergies.    Review of Systems  Gastrointestinal:  Positive for constipation.  All other systems reviewed and are negative.   Updated Vital Signs BP 138/88 (BP Location: Left Arm)   Pulse (!) 110   Temp 98.3 F (36.8 C) (Oral)   Resp 18   LMP 11/28/2015   SpO2 99%   Physical Exam  Vitals and nursing note reviewed.  Constitutional:      Appearance: She is well-developed.  HENT:     Head: Normocephalic.  Cardiovascular:     Rate and Rhythm: Normal rate.  Pulmonary:     Effort: Pulmonary effort is normal.  Abdominal:     General: Abdomen is flat. There is no distension.     Palpations: Abdomen is soft.  Musculoskeletal:        General: Normal range of motion.     Cervical back: Normal range of motion.  Skin:    General: Skin is warm.  Neurological:     General: No focal deficit present.     Mental Status: She is alert and oriented to person, place, and time.      (all labs ordered are listed, but only abnormal results are displayed) Labs Reviewed  URINALYSIS, ROUTINE W REFLEX MICROSCOPIC    EKG: None  Radiology: DG Abdomen 1 View Result Date: 09/05/2023 CLINICAL DATA:  constipation EXAM: ABDOMEN - 1 VIEW COMPARISON:  None Available. FINDINGS: Nonobstructive bowel gas pattern. Scattered gas and stool in the colon. No definite radiopaque calculi. Pelvic phleboliths. No acute osseous abnormality. IMPRESSION: Nonobstructive bowel gas pattern. Scattered gas and stool in the colon. Electronically Signed   By: Harrietta Sherry M.D.   On: 09/05/2023 18:58     Procedures   Medications Ordered in the ED - No data to display                                  Medical Decision Making Amount and/or Complexity of Data Reviewed Labs: ordered. Radiology: ordered.   Pt's care turned over to [facility]     Final diagnoses:  Drug-induced constipation    ED Discharge Orders     None       An After Visit Summary was printed and given to the patient.    Flint Sonny POUR, PA-C 09/05/23 1953    Towana Ozell BROCKS, MD 09/06/23 [phone]

## 2023-09-05 NOTE — ED Triage Notes (Signed)
 Pt states she has been constipated for the past 6 days has not had a BM. Has tried suppositories, miralax. Pt denies any n/v or abdominal pain.

## 2023-09-05 NOTE — ED Provider Notes (Signed)
 Patient received in signout.  See previous note for full details.  Pending UA at the end of shift change. Physical Exam  BP 138/88 (BP Location: Left Arm)   Pulse (!) 110   Temp 98.3 F (36.8 C) (Oral)   Resp 18   LMP 11/28/2015   SpO2 99%     Procedures  Procedures  ED Course / MDM    Medical Decision Making Amount and/or Complexity of Data Reviewed Labs: ordered. Radiology: ordered.  Risk Prescription drug management.   UA with evidence of UTI.  Keflex  dose given in the ED.  Keflex  prescribed.  Discharged in stable condition.  Patient is in agreement with plan.       Hildegard Loge, PA-C 09/05/23 2201    Gennaro Duwaine CROME, DO 09/06/23 1635

## 2023-09-05 NOTE — Discharge Instructions (Addendum)
 Mix 1 bottle of MiraLAX in 2 bottles of 32 ounce Gatorade.  Drink 8 ounces every hour until you have had 2 good bowel movements.  Try taking Metamucil daily to help with constipation.  Discussed medications that may help with constipation while you are taking pain medications.  Return if any problems.  You can use fleets enemas to help with constipation
